# Patient Record
Sex: Male | Born: 1945 | Race: White | Hispanic: No | Marital: Married | Smoking: Current every day smoker
Health system: Southern US, Community
[De-identification: ages and names within clinical notes are randomized; demographics above are authoritative.]

## PROBLEM LIST (undated history)

## (undated) DIAGNOSIS — M1990 Unspecified osteoarthritis, unspecified site: Secondary | ICD-10-CM

## (undated) DIAGNOSIS — N200 Calculus of kidney: Secondary | ICD-10-CM

## (undated) HISTORY — PX: TONSILLECTOMY: SUR1361

## (undated) HISTORY — DX: Calculus of kidney: N20.0

## (undated) HISTORY — DX: Unspecified osteoarthritis, unspecified site: M19.90

---

## 2005-04-01 ENCOUNTER — Ambulatory Visit (HOSPITAL_BASED_OUTPATIENT_CLINIC_OR_DEPARTMENT_OTHER): Admission: RE | Admit: 2005-04-01 | Discharge: 2005-04-01 | Payer: Self-pay | Admitting: Orthopedic Surgery

## 2009-03-09 DIAGNOSIS — N200 Calculus of kidney: Secondary | ICD-10-CM

## 2009-03-09 HISTORY — DX: Calculus of kidney: N20.0

## 2010-05-07 ENCOUNTER — Emergency Department (HOSPITAL_COMMUNITY): Payer: Federal, State, Local not specified - PPO

## 2010-05-07 ENCOUNTER — Emergency Department (HOSPITAL_COMMUNITY)
Admission: EM | Admit: 2010-05-07 | Discharge: 2010-05-08 | Disposition: A | Discharge status: Home / Self Care | Payer: Federal, State, Local not specified - PPO | Attending: Emergency Medicine | Admitting: Emergency Medicine

## 2010-05-07 DIAGNOSIS — N281 Cyst of kidney, acquired: Secondary | ICD-10-CM | POA: Insufficient documentation

## 2010-05-07 DIAGNOSIS — Z7982 Long term (current) use of aspirin: Secondary | ICD-10-CM | POA: Insufficient documentation

## 2010-05-07 DIAGNOSIS — Z87442 Personal history of urinary calculi: Secondary | ICD-10-CM | POA: Insufficient documentation

## 2010-05-07 DIAGNOSIS — F17200 Nicotine dependence, unspecified, uncomplicated: Secondary | ICD-10-CM | POA: Insufficient documentation

## 2010-05-07 DIAGNOSIS — N200 Calculus of kidney: Secondary | ICD-10-CM | POA: Insufficient documentation

## 2010-05-07 DIAGNOSIS — N400 Enlarged prostate without lower urinary tract symptoms: Secondary | ICD-10-CM | POA: Insufficient documentation

## 2010-05-07 LAB — BASIC METABOLIC PANEL
BUN: 16 mg/dL (ref 6–23)
Chloride: 104 mEq/L (ref 96–112)
Creatinine, Ser: 1.09 mg/dL (ref 0.4–1.5)
GFR calc Af Amer: >60 mL/min (ref >60)
GFR calc non Af Amer: >60 mL/min (ref >60)
Potassium: 4.1 mEq/L (ref 3.5–5.1)

## 2010-05-07 LAB — DIFFERENTIAL
Basophils Relative: 1 % (ref 0–1)
Eosinophils Absolute: 0.1 10*3/uL (ref 0.0–0.7)
Eosinophils Relative: 1 % (ref 0–5)
Neutrophils Relative %: 80 % — ABNORMAL HIGH (ref 43–77)

## 2010-05-07 LAB — CBC
MCH: 29.9 pg (ref 26.0–34.0)
MCV: 86.5 fL (ref 78.0–100.0)
Platelets: 249 10*3/uL (ref 150–400)
RBC: 4.88 MIL/uL (ref 4.22–5.81)
RDW: 12.7 % (ref 11.5–15.5)
WBC: 13.3 10*3/uL — ABNORMAL HIGH (ref 4.0–10.5)

## 2010-05-07 LAB — URINALYSIS, ROUTINE W REFLEX MICROSCOPIC
Leukocytes, UA: NEGATIVE
Specific Gravity, Urine: 1.033 — ABNORMAL HIGH (ref 1.005–1.030)
Urine Glucose, Fasting: NEGATIVE mg/dL
pH: 5 (ref 5.0–8.0)

## 2010-05-07 LAB — URINE MICROSCOPIC-ADD ON

## 2010-05-09 LAB — URINE CULTURE: Culture: NO GROWTH

## 2010-11-02 ENCOUNTER — Ambulatory Visit (INDEPENDENT_AMBULATORY_CARE_PROVIDER_SITE_OTHER): Payer: Federal, State, Local not specified - PPO

## 2010-11-02 ENCOUNTER — Inpatient Hospital Stay (INDEPENDENT_AMBULATORY_CARE_PROVIDER_SITE_OTHER)
Admission: RE | Admit: 2010-11-02 | Discharge: 2010-11-02 | Disposition: A | Discharge status: Home / Self Care | Payer: Federal, State, Local not specified - PPO | Source: Ambulatory Visit | Attending: Family Medicine | Admitting: Family Medicine

## 2010-11-02 DIAGNOSIS — J45909 Unspecified asthma, uncomplicated: Secondary | ICD-10-CM

## 2011-02-02 ENCOUNTER — Ambulatory Visit (INDEPENDENT_AMBULATORY_CARE_PROVIDER_SITE_OTHER): Payer: Federal, State, Local not specified - PPO | Admitting: Family Medicine

## 2011-02-02 ENCOUNTER — Encounter: Payer: Self-pay | Admitting: Family Medicine

## 2011-02-02 DIAGNOSIS — M1990 Unspecified osteoarthritis, unspecified site: Secondary | ICD-10-CM

## 2011-02-02 DIAGNOSIS — N529 Male erectile dysfunction, unspecified: Secondary | ICD-10-CM

## 2011-02-02 DIAGNOSIS — F17200 Nicotine dependence, unspecified, uncomplicated: Secondary | ICD-10-CM | POA: Insufficient documentation

## 2011-02-02 DIAGNOSIS — Z23 Encounter for immunization: Secondary | ICD-10-CM

## 2011-02-02 MED ORDER — TADALAFIL 20 MG PO TABS: 20.0000 mg | ORAL_TABLET | Freq: Every day | ORAL | Status: AC | PRN

## 2011-02-02 MED ORDER — VARENICLINE TARTRATE 1 MG PO TABS: 1.0000 mg | ORAL_TABLET | Freq: Two times a day (BID) | ORAL | Status: AC

## 2011-02-02 MED ORDER — VARENICLINE TARTRATE 0.5 MG X 11 & 1 MG X 42 PO MISC: ORAL | Status: AC

## 2011-02-02 NOTE — Progress Notes (Signed)
  Subjective:    Patient ID: Maxwell Leonard, male    DOB: [birth year], 65 y.o.   MRN: [id number]  HPI  New patient to establish care. Past medical history reviewed. Basically no chronic medical problems. He does smoke one half pack cigarettes per day and wishes to quit. Specifically requests trial of Chantix. He's tried nicotine replacement in the past without much success. Previous influenza vaccine. No Pneumovax. No history of known COPD. Denies any chronic cough or dyspnea.  History of osteoarthritis mostly involving hands. Mostly distal interphalangeal joint. No significant pain. No loss of mobility.  Right foot mass. First noted couple years ago after hitting a golf ball. Possibly enlarging in size since then. No pain with ambulation. Nontender to palpation.  History of erectile dysfunction. Previously has used Cialis with good results. Requesting refill. No history of nitroglycerin use. No cardiac history.  Past Medical History  Diagnosis Date  . Arthritis   . Kidney stones 2011    had kidney stones spring of the year and then previously 20 yrs    Past Surgical History  Procedure Date  . Tonsillectomy     reports that he has been smoking Cigarettes.  He has been smoking about .5 packs per day. He has never used smokeless tobacco. He reports that he drinks alcohol. He reports that he does not use illicit drugs. family history includes Alzheimer's disease in his mother; Cancer (age of onset:86) in his father; and Healthy in his daughter and son. No Known Allergies    Review of Systems  Constitutional: Negative for fever, activity change, appetite change and fatigue.  HENT: Negative for ear pain, congestion and trouble swallowing.   Eyes: Negative for pain and visual disturbance.  Respiratory: Negative for cough, shortness of breath and wheezing.   Cardiovascular: Negative for chest pain and palpitations.  Gastrointestinal: Negative for nausea, vomiting, abdominal pain, diarrhea,  constipation, blood in stool, abdominal distention and rectal pain.  Genitourinary: Negative for dysuria, hematuria and testicular pain.  Musculoskeletal: Negative for joint swelling and arthralgias.  Skin: Negative for rash.  Neurological: Negative for dizziness, syncope and headaches.  Hematological: Negative for adenopathy.  Psychiatric/Behavioral: Negative for confusion and dysphoric mood.       Objective:   Physical Exam  Constitutional: He is oriented to person, place, and time. He appears well-developed and well-nourished. No distress.  HENT:  Mouth/Throat: Oropharynx is clear and moist.  Neck: Neck supple. No thyromegaly present.  Cardiovascular: Normal rate and regular rhythm.   Pulmonary/Chest: Effort normal and breath sounds normal. No respiratory distress. He has no wheezes. He has no rales.  Musculoskeletal: He exhibits no edema.       Right foot reveals nontender bony prominence mid and forefoot. Full range of motion ankle  Lymphadenopathy:    He has no cervical adenopathy.  Neurological: He is alert and oriented to person, place, and time.          Assessment & Plan:  #1 nicotine abuse. Discussed methods for cessation. Trial of Chantix with prescription written. Pneumovax given.  Spent about 5 minutes discussing smoking cessation. #2 erectile dysfunction. Cialis 20 mg every other day as needed #3 osteoarthritis involving hands. No significant pain issues #4 right foot mass. Suspect benign bony mass. Offered x-rays but he has established already with orthopedist and will followup with him

## 2011-02-02 NOTE — Patient Instructions (Signed)
Smoking Cessation This document explains the best ways for you to quit smoking and new treatments to help. It lists new medicines that can double or triple your chances of quitting and quitting for good. It also considers ways to avoid relapses and concerns you may have about quitting, including weight gain. NICOTINE: A POWERFUL ADDICTION If you have tried to quit smoking, you know how hard it can be. It is hard because nicotine is a very addictive drug. For some people, it can be as addictive as heroin or cocaine. Usually, people make 2 or 3 tries, or more, before finally being able to quit. Each time you try to quit, you can learn about what helps and what hurts. Quitting takes hard work and a lot of effort, but you can quit smoking. QUITTING SMOKING IS ONE OF THE MOST IMPORTANT THINGS YOU WILL EVER DO.  You will live longer, feel better, and live better.   The impact on your body of quitting smoking is felt almost immediately:   Within 20 minutes, blood pressure decreases. Pulse returns to its normal level.   After 8 hours, carbon monoxide levels in the blood return to normal. Oxygen level increases.   After 24 hours, chance of heart attack starts to decrease. Breath, hair, and body stop smelling like smoke.   After 48 hours, damaged nerve endings begin to recover. Sense of taste and smell improve.   After 72 hours, the body is virtually free of nicotine. Bronchial tubes relax and breathing becomes easier.   After 2 to 12 weeks, lungs can hold more air. Exercise becomes easier and circulation improves.   Quitting will reduce your risk of having a heart attack, stroke, cancer, or lung disease:   After 1 year, the risk of coronary heart disease is cut in half.   After 5 years, the risk of stroke falls to the same as a nonsmoker.   After 10 years, the risk of lung cancer is cut in half and the risk of other cancers decreases significantly.   After 15 years, the risk of coronary heart  disease drops, usually to the level of a nonsmoker.   If you are pregnant, quitting smoking will improve your chances of having a healthy baby.   The people you live with, especially your children, will be healthier.   You will have extra money to spend on things other than cigarettes.  FIVE KEYS TO QUITTING Studies have shown that these 5 steps will help you quit smoking and quit for good. You have the best chances of quitting if you use them together: 1. Get ready.  2. Get support and encouragement.  3. Learn new skills and behaviors.  4. Get medicine to reduce your nicotine addiction and use it correctly.  5. Be prepared for relapse or difficult situations. Be determined to continue trying to quit, even if you do not succeed at first.  1. GET READY  Set a quit date.   Change your environment.   Get rid of ALL cigarettes, ashtrays, matches, and lighters in your home, car, and place of work.   Do not let people smoke in your home.   Review your past attempts to quit. Think about what worked and what did not.   Once you quit, do not smoke. NOT EVEN A PUFF!  2. GET SUPPORT AND ENCOURAGEMENT Studies have shown that you have a better chance of being successful if you have help. You can get support in many ways.  Tell   your family, friends, and coworkers that you are going to quit and need their support. Ask them not to smoke around you.   Talk to your caregivers (doctor, dentist, nurse, pharmacist, psychologist, and/or smoking counselor).   Get individual, group, or telephone counseling and support. The more counseling you have, the better your chances are of quitting. Programs are available at local hospitals and health centers. Call your local health department for information about programs in your area.   Spiritual beliefs and practices may help some smokers quit.   Quit meters are small computer programs online or downloadable that keep track of quit statistics, such as amount  of "quit-time," cigarettes not smoked, and money saved.   Many smokers find one or more of the many self-help books available useful in helping them quit and stay off tobacco.  3. LEARN NEW SKILLS AND BEHAVIORS  Try to distract yourself from urges to smoke. Talk to someone, go for a walk, or occupy your time with a task.   When you first try to quit, change your routine. Take a different route to work. Drink tea instead of coffee. Eat breakfast in a different place.   Do something to reduce your stress. Take a hot bath, exercise, or read a book.   Plan something enjoyable to do every day. Reward yourself for not smoking.   Explore interactive web-based programs that specialize in helping you quit.  4. GET MEDICINE AND USE IT CORRECTLY Medicines can help you stop smoking and decrease the urge to smoke. Combining medicine with the above behavioral methods and support can quadruple your chances of successfully quitting smoking. The [place] Food and Drug Administration (FDA) has approved 7 medicines to help you quit smoking. These medicines fall into 3 categories.  Nicotine replacement therapy (delivers nicotine to your body without the negative effects and risks of smoking):   Nicotine gum: Available over-the-counter.   Nicotine lozenges: Available over-the-counter.   Nicotine inhaler: Available by prescription.   Nicotine nasal spray: Available by prescription.   Nicotine skin patches (transdermal): Available by prescription and over-the-counter.   Antidepressant medicine (helps people abstain from smoking, but how this works is unknown):   Bupropion sustained-release (SR) tablets: Available by prescription.   Nicotinic receptor partial agonist (simulates the effect of nicotine in your brain):   Varenicline tartrate tablets: Available by prescription.   Ask your caregiver for advice about which medicines to use and how to use them. Carefully read the information on the package.    Everyone who is trying to quit may benefit from using a medicine. If you are pregnant or trying to become pregnant, nursing an infant, you are under age 18, or you smoke fewer than 10 cigarettes per day, talk to your caregiver before taking any nicotine replacement medicines.   You should stop using a nicotine replacement product and call your caregiver if you experience nausea, dizziness, weakness, vomiting, fast or irregular heartbeat, mouth problems with the lozenge or gum, or redness or swelling of the skin around the patch that does not go away.   Do not use any other product containing nicotine while using a nicotine replacement product.   Talk to your caregiver before using these products if you have diabetes, heart disease, asthma, stomach ulcers, you had a recent heart attack, you have high blood pressure that is not controlled with medicine, a history of irregular heartbeat, or you have been prescribed medicine to help you quit smoking.  5. BE PREPARED FOR RELAPSE OR   DIFFICULT SITUATIONS  Most relapses occur within the first 3 months after quitting. Do not be discouraged if you start smoking again. Remember, most people try several times before they finally quit.   You may have symptoms of withdrawal because your body is used to nicotine. You may crave cigarettes, be irritable, feel very hungry, cough often, get headaches, or have difficulty concentrating.   The withdrawal symptoms are only temporary. They are strongest when you first quit, but they will go away within 10 to 14 days.  Here are some difficult situations to watch for:  Alcohol. Avoid drinking alcohol. Drinking lowers your chances of successfully quitting.   Caffeine. Try to reduce the amount of caffeine you consume. It also lowers your chances of successfully quitting.   Other smokers. Being around smoking can make you want to smoke. Avoid smokers.   Weight gain. Many smokers will gain weight when they quit, usually  less than 10 pounds. Eat a healthy diet and stay active. Do not let weight gain distract you from your main goal, quitting smoking. Some medicines that help you quit smoking may also help delay weight gain. You can always lose the weight gained after you quit.   Bad mood or depression. There are a lot of ways to improve your mood other than smoking.  If you are having problems with any of these situations, talk to your caregiver. SPECIAL SITUATIONS AND CONDITIONS Studies suggest that everyone can quit smoking. Your situation or condition can give you a special reason to quit.  Pregnant women/new mothers: By quitting, you protect your baby's health and your own.   Hospitalized patients: By quitting, you reduce health problems and help healing.   Heart attack patients: By quitting, you reduce your risk of a second heart attack.   Lung, head, and neck cancer patients: By quitting, you reduce your chance of a second cancer.   Parents of children and adolescents: By quitting, you protect your children from illnesses caused by secondhand smoke.  QUESTIONS TO THINK ABOUT Think about the following questions before you try to stop smoking. You may want to talk about your answers with your caregiver.  Why do you want to quit?   If you tried to quit in the past, what helped and what did not?   What will be the most difficult situations for you after you quit? How will you plan to handle them?   Who can help you through the tough times? Your family? Friends? Caregiver?   What pleasures do you get from smoking? What ways can you still get pleasure if you quit?  Here are some questions to ask your caregiver:  How can you help me to be successful at quitting?   What medicine do you think would be best for me and how should I take it?   What should I do if I need more help?   What is smoking withdrawal like? How can I get information on withdrawal?  Quitting takes hard work and a lot of effort,  but you can quit smoking. FOR MORE INFORMATION  Smokefree.gov (http://www.davis-sullivan.com/) provides free, accurate, evidence-based information and professional assistance to help support the immediate and long-term needs of people trying to quit smoking. Document Released: 02/17/2001 Document Revised: 11/05/2010 Document Reviewed: 12/10/2008 [facility] Patient Information 2012 [place], [place].  Consider complete physical at some point later this year. Follow up with Dr Cleophas Dunker regarding R foot mass.

## 2012-03-01 ENCOUNTER — Encounter: Payer: Self-pay | Admitting: Family Medicine

## 2012-03-01 ENCOUNTER — Ambulatory Visit (INDEPENDENT_AMBULATORY_CARE_PROVIDER_SITE_OTHER): Payer: Federal, State, Local not specified - PPO | Admitting: Family Medicine

## 2012-03-01 VITALS — BP 120/80 | Temp 98.2°F | Wt 176.0 lb

## 2012-03-01 DIAGNOSIS — J9801 Acute bronchospasm: Secondary | ICD-10-CM

## 2012-03-01 MED ORDER — ALBUTEROL SULFATE HFA 108 (90 BASE) MCG/ACT IN AERS: 2.0000 | INHALATION_SPRAY | RESPIRATORY_TRACT | Status: DC | PRN

## 2012-03-01 MED ORDER — DOXYCYCLINE HYCLATE 100 MG PO TABS: 100.0000 mg | ORAL_TABLET | Freq: Two times a day (BID) | ORAL | Status: DC

## 2012-03-01 MED ORDER — PREDNISONE 10 MG PO TABS: ORAL_TABLET | ORAL | Status: DC

## 2012-03-01 NOTE — Progress Notes (Signed)
  Subjective:    Patient ID: Maxwell Leonard, male    DOB: 10-Dec-1945, 66 y.o.   MRN: [id number]  HPI  Acute visit. Patient seen with cough productive of white mucus. Onset about a week ago. No fever. Slight wheezing off and on. Has previously used albuterol inhaler for similar episode which helped. Ongoing nicotine use. Took Mucinex with no significant change of symptoms. Patient denies any hemoptysis, dyspnea, appetite or weight changes.   Review of Systems  Constitutional: Negative for fever, chills, appetite change and unexpected weight change.  HENT: Positive for congestion. Negative for sore throat.   Respiratory: Positive for cough and wheezing. Negative for shortness of breath.   Cardiovascular: Negative for chest pain.       Objective:   Physical Exam  Constitutional: He appears well-developed and well-nourished.  HENT:  Right Ear: External ear normal.  Left Ear: External ear normal.  Mouth/Throat: Oropharynx is clear and moist.  Neck: Neck supple.  Cardiovascular: Normal rate.   Pulmonary/Chest: Effort normal.       Patient has some diffuse wheezes. No rales. No retractions. Normal respiratory rate  Lymphadenopathy:    He has no cervical adenopathy.          Assessment & Plan:  Cough probably related to recent viral URI. He has reactive airway component. Prednisone taper. Doxycycline 100 mg twice a day for 7 days. Albuterol inhaler for as needed use. Continue Mucinex. Followup promptly for fever or worsening symptoms

## 2013-06-06 ENCOUNTER — Encounter: Payer: Self-pay | Admitting: Family Medicine

## 2013-06-06 ENCOUNTER — Ambulatory Visit (INDEPENDENT_AMBULATORY_CARE_PROVIDER_SITE_OTHER): Payer: Federal, State, Local not specified - PPO | Admitting: Family Medicine

## 2013-06-06 ENCOUNTER — Telehealth: Payer: Self-pay | Admitting: Family Medicine

## 2013-06-06 VITALS — BP 122/84 | HR 69 | Wt 180.0 lb

## 2013-06-06 DIAGNOSIS — R140 Abdominal distension (gaseous): Secondary | ICD-10-CM

## 2013-06-06 DIAGNOSIS — Z1211 Encounter for screening for malignant neoplasm of colon: Secondary | ICD-10-CM

## 2013-06-06 DIAGNOSIS — R142 Eructation: Secondary | ICD-10-CM

## 2013-06-06 DIAGNOSIS — R141 Gas pain: Secondary | ICD-10-CM

## 2013-06-06 DIAGNOSIS — R143 Flatulence: Secondary | ICD-10-CM

## 2013-06-06 NOTE — Progress Notes (Signed)
Pre visit review using our clinic review tool, if applicable. No additional management support is needed unless otherwise documented below in the visit note. 

## 2013-06-06 NOTE — Telephone Encounter (Signed)
Relevant patient education mailed to patient.  

## 2013-06-06 NOTE — Patient Instructions (Signed)
We will call you regarding colonoscopy. Avoid carbonated drinks for now Avoid gas producing foods for now-beans, high residue foods next few days.

## 2013-06-06 NOTE — Progress Notes (Signed)
   Subjective:    Patient ID: Maxwell Leonard, male    DOB: [birth year], 68 y.o.   MRN: [id number]  Abdominal Pain Pertinent negatives include no constipation, diarrhea, dysuria, fever, nausea or vomiting.   Patient  presents  with 2 day history of some diffuse lower abdominal mild cramping which he describes as " gas pains". Burping a lot. He took some simethicone over-the-counter which helped somewhat. He denies any nausea, vomiting, diarrhea, constipation, bloody stools, fever, chills. No urinary symptoms. No exacerbating factors. No pain with ambulation. He states he has never had previous screening colonoscopy. No recent appetite or weight changes.  Denies any recent dietary changes  Past Medical History  Diagnosis Date  . Arthritis   . Kidney stones 2011    had kidney stones spring of the year and then previously 20 yrs    Past Surgical History  Procedure Laterality Date  . Tonsillectomy      reports that he has been smoking Cigarettes.  He has been smoking about 0.50 packs per day. He has never used smokeless tobacco. He reports that he drinks alcohol. He reports that he does not use illicit drugs. family history includes Alzheimer's disease in his mother; Cancer (age of onset: 62) in his father; Healthy in his daughter and son. No Known Allergies   Review of Systems  Constitutional: Negative for fever, chills, appetite change and unexpected weight change.  HENT: Negative for trouble swallowing.   Respiratory: Negative for shortness of breath.   Cardiovascular: Negative for chest pain.  Gastrointestinal: Positive for abdominal pain. Negative for nausea, vomiting, diarrhea, constipation, blood in stool and abdominal distention.  Genitourinary: Negative for dysuria.       Objective:   Physical Exam  Constitutional: He appears well-developed and well-nourished.  Cardiovascular: Normal rate and regular rhythm.   Pulmonary/Chest: Effort normal and breath sounds normal. No  respiratory distress. He has no wheezes. He has no rales.  Abdominal: Soft. He exhibits no distension and no mass. There is no tenderness. There is no rebound and no guarding.  Patient has somewhat hyperactive bowel sounds diffusely          Assessment & Plan:  Increased gas and gaseous pains. Nonfocal exam. Continue simethicone. Low-residue diet for the next few days. Schedule screening colonoscopy.

## 2013-07-06 ENCOUNTER — Encounter: Payer: Self-pay | Admitting: Family Medicine

## 2013-12-22 ENCOUNTER — Ambulatory Visit (INDEPENDENT_AMBULATORY_CARE_PROVIDER_SITE_OTHER): Payer: Federal, State, Local not specified - PPO | Admitting: Physician Assistant

## 2013-12-22 ENCOUNTER — Encounter: Payer: Self-pay | Admitting: Physician Assistant

## 2013-12-22 VITALS — BP 108/80 | HR 66 | Temp 98.6°F | Resp 18 | Wt 174.6 lb

## 2013-12-22 DIAGNOSIS — J209 Acute bronchitis, unspecified: Secondary | ICD-10-CM

## 2013-12-22 MED ORDER — PREDNISONE 20 MG PO TABS: 40.0000 mg | ORAL_TABLET | Freq: Every day | ORAL | Status: DC

## 2013-12-22 MED ORDER — DOXYCYCLINE HYCLATE 100 MG PO TABS: 100.0000 mg | ORAL_TABLET | Freq: Two times a day (BID) | ORAL | Status: DC

## 2013-12-22 NOTE — Progress Notes (Signed)
Subjective:    Patient ID: Maxwell Leonard, male    DOB: Nov 07, 1945, 68 y.o.   MRN: [id number]  URI  This is a new problem. The current episode started 1 to 4 weeks ago (10 days). The problem has been gradually worsening. There has been no fever. Associated symptoms include congestion and coughing (sputum). Pertinent negatives include no abdominal pain, chest pain, diarrhea, dysuria, ear pain, headaches, joint pain, joint swelling, nausea, neck pain, plugged ear sensation, rash, rhinorrhea, sinus pain, sneezing, sore throat, swollen glands, vomiting or wheezing. Treatments tried: mucinex, vapor,. The treatment provided mild relief.      Review of Systems  Constitutional: Negative for fever and chills.  HENT: Positive for congestion. Negative for ear pain, postnasal drip, rhinorrhea, sinus pressure, sneezing and sore throat.   Respiratory: Positive for cough (sputum). Negative for shortness of breath and wheezing.   Cardiovascular: Negative for chest pain.  Gastrointestinal: Negative for nausea, vomiting, abdominal pain and diarrhea.  Genitourinary: Negative for dysuria.  Musculoskeletal: Negative for joint pain and neck pain.  Skin: Negative for rash.  Neurological: Negative for syncope and headaches.  All other systems reviewed and are negative.    Past Medical History  Diagnosis Date  . Arthritis   . Kidney stones 2011    had kidney stones spring of the year and then previously 20 yrs     History   Social History  . Marital Status: Married    Spouse Name: N/A    Number of Children: N/A  . Years of Education: N/A   Occupational History  . Not on file.   Social History Main Topics  . Smoking status: Current Every Day Smoker -- 0.50 packs/day    Types: Cigarettes  . Smokeless tobacco: Never Used  . Alcohol Use: Yes  . Drug Use: No  . Sexual Activity: Not on file   Other Topics Concern  . Not on file   Social History Narrative  . No narrative on file    Past  Surgical History  Procedure Laterality Date  . Tonsillectomy      Family History  Problem Relation Age of Onset  . Healthy Daughter   . Healthy Son   . Alzheimer's disease Mother   . Cancer Father 95    lung cancer    No Known Allergies  Current Outpatient Prescriptions on File Prior to Visit  Medication Sig Dispense Refill  . albuterol (PROVENTIL HFA;VENTOLIN HFA) 108 (90 BASE) MCG/ACT inhaler Inhale 2 puffs into the lungs every 4 (four) hours as needed for wheezing.  1 Inhaler  2  . aspirin 81 MG tablet Take 81 mg by mouth daily.        . naproxen sodium (ANAPROX) 220 MG tablet Take 220 mg by mouth. As needed       No current facility-administered medications on file prior to visit.    EXAM: BP 108/80  Pulse 66  Temp(Src) 98.6 F (37 C) (Oral)  Resp 18  Wt 174 lb 9.6 oz (79.198 kg)     Objective:   Physical Exam  Nursing note and vitals reviewed. Constitutional: He is oriented to person, place, and time. He appears well-developed and well-nourished. No distress.  HENT:  Head: Normocephalic and atraumatic.  Mouth/Throat: Oropharynx is clear and moist.  Eyes: Conjunctivae and EOM are normal.  Neck: Normal range of motion. Neck supple.  Cardiovascular: Normal rate, regular rhythm and intact distal pulses.   Pulmonary/Chest: Effort normal. No stridor. No respiratory distress.  He has wheezes. He has no rales. He exhibits no tenderness.  Coarse rhonchi heard bilateral lung bases.  Lymphadenopathy:    He has no cervical adenopathy.  Neurological: He is alert and oriented to person, place, and time.  Skin: Skin is warm and dry. He is not diaphoretic. No pallor.  Psychiatric: He has a normal mood and affect. His behavior is normal. Judgment and thought content normal.    Lab Results  Component Value Date   WBC 13.3* 05/07/2010   HGB 14.6 05/07/2010   HCT 42.2 05/07/2010   PLT 249 05/07/2010   GLUCOSE 115* 05/07/2010   NA 138 05/07/2010   K 4.1 05/07/2010   CL 104  05/07/2010   CREATININE 1.09 05/07/2010   BUN 16 05/07/2010   CO2 26 05/07/2010         Assessment & Plan:  Maxwell Leonard was seen today for cough.  Diagnoses and associated orders for this visit:  Acute bronchitis with bronchospasm Comments: Prednisone for wheezing, Doxycycline, continue mucinex, push fluids, watchful waiting. - doxycycline (VIBRA-TABS) 100 MG tablet; Take 1 tablet (100 mg total) by mouth 2 (two) times daily. - predniSONE (DELTASONE) 20 MG tablet; Take 2 tablets (40 mg total) by mouth daily with breakfast.    Return precautions provided, and patient handout on Bronchitis, wheezing.  Plan to follow up as needed, or for worsening or persistent symptoms despite treatment.  Patient Instructions  Doxycycline twice daily for 7 days. Take each dose with a full meal prevent nausea.  Prednisone 2 pills daily with breakfast decrease inflammation. Take each dose with a full meal to prevent nausea.  Plain Over the Counter Mucinex (NOT Mucinex D) for thick secretions  Force NON dairy fluids, drinking plenty of water is best.    If emergency symptoms discussed during visit developed, seek medical attention immediately.  Followup as needed, or for worsening or persistent symptoms despite treatment.

## 2013-12-22 NOTE — Progress Notes (Signed)
Pre visit review using our clinic review tool, if applicable. No additional management support is needed unless otherwise documented below in the visit note.,j

## 2013-12-22 NOTE — Patient Instructions (Addendum)
Doxycycline twice daily for 7 days. Take each dose with a full meal prevent nausea.  Prednisone 2 pills daily with breakfast decrease inflammation. Take each dose with a full meal to prevent nausea.  Plain Over the Counter Mucinex (NOT Mucinex D) for thick secretions  Force NON dairy fluids, drinking plenty of water is best.    If emergency symptoms discussed during visit developed, seek medical attention immediately.  Followup as needed, or for worsening or persistent symptoms despite treatment.     Acute Bronchitis Bronchitis is when the airways that extend from the windpipe into the lungs get red, puffy, and painful (inflamed). Bronchitis often causes thick spit (mucus) to develop. This leads to a cough. A cough is the most common symptom of bronchitis. In acute bronchitis, the condition usually begins suddenly and goes away over time (usually in 2 weeks). Smoking, allergies, and asthma can make bronchitis worse. Repeated episodes of bronchitis may cause more lung problems. HOME CARE  Rest.  Drink enough fluids to keep your pee (urine) clear or pale yellow (unless you need to limit fluids as told by your doctor).  Only take over-the-counter or prescription medicines as told by your doctor.  Avoid smoking and secondhand smoke. These can make bronchitis worse. If you are a smoker, think about using nicotine gum or skin patches. Quitting smoking will help your lungs heal faster.  Reduce the chance of getting bronchitis again by:  Washing your hands often.  Avoiding people with cold symptoms.  Trying not to touch your hands to your mouth, nose, or eyes.  Follow up with your doctor as told. GET HELP IF: Your symptoms do not improve after 1 week of treatment. Symptoms include:  Cough.  Fever.  Coughing up thick spit.  Body aches.  Chest congestion.  Chills.  Shortness of breath.  Sore throat. GET HELP RIGHT AWAY IF:   You have an increased fever.  You have  chills.  You have severe shortness of breath.  You have bloody thick spit (sputum).  You throw up (vomit) often.  You lose too much body fluid (dehydration).  You have a severe headache.  You faint. MAKE SURE YOU:   Understand these instructions.  Will watch your condition.  Will get help right away if you are not doing well or get worse. Document Released: 08/12/2007 Document Revised: 10/26/2012 Document Reviewed: 08/16/2012 [facility] Patient Information 2015 [place], [place]. This information is not intended to replace advice given to you by your health care provider. Make sure you discuss any questions you have with your health care provider. Bronchospasm A bronchospasm is when the tubes that carry air in and out of your lungs (airways) spasm or tighten. During a bronchospasm it is hard to breathe. This is because the airways get smaller. A bronchospasm can be triggered by:  Allergies. These may be to animals, pollen, food, or mold.  Infection. This is a common cause of bronchospasm.  Exercise.  Irritants. These include pollution, cigarette smoke, strong odors, aerosol sprays, and paint fumes.  Weather changes.  Stress.  Being emotional. HOME CARE   Always have a plan for getting help. Know when to call your doctor and local emergency services (911 in the [place] Know where you can get emergency care.  Only take medicines as told by your doctor.  If you were prescribed an inhaler or nebulizer machine, ask your doctor how to use it correctly. Always use a spacer with your inhaler if you were given one.  Stay  calm during an attack. Try to relax and breathe more slowly.  Control your home environment:  Change your heating and air conditioning filter at least once a month.  Limit your use of fireplaces and wood stoves.  Do not  smoke. Do not  allow smoking in your home.  Avoid perfumes and fragrances.  Get rid of pests (such as roaches and mice) and their  droppings.  Throw away plants if you see mold on them.  Keep your house clean and dust free.  Replace carpet with wood, tile, or vinyl flooring. Carpet can trap dander and dust.  Use allergy-proof pillows, mattress covers, and box spring covers.  Wash bed sheets and blankets every week in hot water. Dry them in a dryer.  Use blankets that are made of polyester or cotton.  Wash hands frequently. GET HELP IF:  You have muscle aches.  You have chest pain.  The thick spit you spit or cough up (sputum) changes from clear or white to yellow, green, gray, or bloody.  The thick spit you spit or cough up gets thicker.  There are problems that may be related to the medicine you are given such as:  A rash.  Itching.  Swelling.  Trouble breathing. GET HELP RIGHT AWAY IF:  You feel you cannot breathe or catch your breath.  You cannot stop coughing.  Your treatment is not helping you breathe better.  You have very bad chest pain. MAKE SURE YOU:   Understand these instructions.  Will watch your condition.  Will get help right away if you are not doing well or get worse. Document Released: 12/21/2008 Document Revised: 02/28/2013 Document Reviewed: 08/16/2012 [facility] Patient Information 2015 [place], [place]. This information is not intended to replace advice given to you by your health care provider. Make sure you discuss any questions you have with your health care provider.

## 2013-12-25 ENCOUNTER — Telehealth: Payer: Self-pay | Admitting: Family Medicine

## 2013-12-25 NOTE — Telephone Encounter (Signed)
emmi mailed  °

## 2013-12-25 NOTE — Telephone Encounter (Signed)
emmi emailed °

## 2014-04-17 ENCOUNTER — Telehealth: Payer: Self-pay

## 2014-04-17 ENCOUNTER — Ambulatory Visit (INDEPENDENT_AMBULATORY_CARE_PROVIDER_SITE_OTHER): Payer: Federal, State, Local not specified - PPO

## 2014-04-17 DIAGNOSIS — Z23 Encounter for immunization: Secondary | ICD-10-CM

## 2014-04-17 NOTE — Telephone Encounter (Signed)
Pt came in for a prevnar 13 today and is requesting a refill of Viagra #20.  Advised pt that he was last given Cialis 20 mg #6 in 2012.  Please advise and send rx to [facility] on [address].

## 2014-04-18 MED ORDER — SILDENAFIL CITRATE 100 MG PO TABS: ORAL_TABLET | ORAL | Status: DC

## 2014-04-18 NOTE — Telephone Encounter (Signed)
Pt states that he does not take Nitroglycerin. Pt aware that Rx is sent to pharmacy .

## 2014-04-18 NOTE — Telephone Encounter (Signed)
We could give Viagra 100 mg 1/2 to one tablet qd prn #6 with 5 refill.  Confirm he is not on nitroglycerin.

## 2014-04-18 NOTE — Telephone Encounter (Signed)
Last visit 05/2013 Viagra is not on his med list.

## 2014-12-31 ENCOUNTER — Encounter: Payer: Self-pay | Admitting: Family Medicine

## 2014-12-31 ENCOUNTER — Ambulatory Visit (INDEPENDENT_AMBULATORY_CARE_PROVIDER_SITE_OTHER): Payer: Federal, State, Local not specified - PPO | Admitting: Family Medicine

## 2014-12-31 VITALS — BP 120/70 | HR 91 | Temp 98.3°F | Wt 167.8 lb

## 2014-12-31 DIAGNOSIS — J209 Acute bronchitis, unspecified: Secondary | ICD-10-CM

## 2014-12-31 MED ORDER — PREDNISONE 20 MG PO TABS: 40.0000 mg | ORAL_TABLET | Freq: Every day | ORAL | Status: DC

## 2014-12-31 MED ORDER — DOXYCYCLINE HYCLATE 100 MG PO CAPS: 100.0000 mg | ORAL_CAPSULE | Freq: Two times a day (BID) | ORAL | Status: DC

## 2014-12-31 NOTE — Progress Notes (Signed)
   Subjective:    Patient ID: Maxwell Leonard, male    DOB: 11-Oct-1945, 69 y.o.   MRN: [id number]  HPI Acute visit. Ongoing smoker who is seen with processing one-week history of productive cough. Cough is worse at night. He has some chills initially but none since then. No fever. Denies any dyspnea. Mild wheezing intermittently. Similar occurrence last year. Still smokes about one half pack cigarettes per day. No recent appetite or weight changes. No hemoptysis.  Past Medical History  Diagnosis Date  . Arthritis   . Kidney stones 2011    had kidney stones spring of the year and then previously 20 yrs    Past Surgical History  Procedure Laterality Date  . Tonsillectomy      reports that he has been smoking Cigarettes.  He has been smoking about 0.50 packs per day. He has never used smokeless tobacco. He reports that he drinks alcohol. He reports that he does not use illicit drugs. family history includes Alzheimer's disease in his mother; Cancer (age of onset: 47) in his father; Healthy in his daughter and son. No Known Allergies    Review of Systems  Constitutional: Negative for fever and chills.  HENT: Negative for congestion and sore throat.   Respiratory: Positive for cough and wheezing. Negative for shortness of breath.   Cardiovascular: Negative for chest pain.       Objective:   Physical Exam  Constitutional: He appears well-developed and well-nourished.  HENT:  Right Ear: External ear normal.  Left Ear: External ear normal.  Mouth/Throat: Oropharynx is clear and moist.  Neck: Neck supple.  Cardiovascular: Normal rate and regular rhythm.   Pulmonary/Chest: Effort normal. No respiratory distress. He has wheezes. He has no rales.  Has only a few faint expiratory wheezes. No rales  Lymphadenopathy:    He has no cervical adenopathy.          Assessment & Plan:  Productive cough in a smoker with reactive airway component. This may be all viral but he does have  productive cough and long history of nicotine use. Prednisone 20 mg 2 tablets daily for 5 days. Continue as needed albuterol. Doxycycline 100 mg twice a day for 10 days. Follow-up promptly for fever or increased shortness of breath.  Suggested complete physical some point within the next year and he will consider. He is encouraged to stop smoking but motivation is low

## 2014-12-31 NOTE — Progress Notes (Signed)
Pre visit review using our clinic review tool, if applicable. No additional management support is needed unless otherwise documented below in the visit note. 

## 2014-12-31 NOTE — Patient Instructions (Signed)
Follow up for any fever or increasing shortness of breath. 

## 2015-02-19 ENCOUNTER — Encounter: Payer: Self-pay | Admitting: Family Medicine

## 2015-02-19 ENCOUNTER — Ambulatory Visit (INDEPENDENT_AMBULATORY_CARE_PROVIDER_SITE_OTHER): Payer: Federal, State, Local not specified - PPO | Admitting: Family Medicine

## 2015-02-19 VITALS — BP 120/70 | HR 83 | Temp 98.2°F | Resp 16 | Wt 171.2 lb

## 2015-02-19 DIAGNOSIS — J209 Acute bronchitis, unspecified: Secondary | ICD-10-CM

## 2015-02-19 MED ORDER — PREDNISONE 20 MG PO TABS: ORAL_TABLET | ORAL | Status: DC

## 2015-02-19 MED ORDER — CEFUROXIME AXETIL 250 MG PO TABS: 250.0000 mg | ORAL_TABLET | Freq: Two times a day (BID) | ORAL | Status: DC

## 2015-02-19 NOTE — Progress Notes (Signed)
   Subjective:    Patient ID: Maxwell Leonard, male    DOB: 1945-05-02, 69 y.o.   MRN: [id number]  HPI Acute visit for cough. Similar cough few months ago which eventually improved following antibiotics and some prednisone. Current cough started a week ago. Mostly clear mucus with sometimes colored mucus early mornings. No definite wheezing. No fevers or chills. Long-term smoker.  Past Medical History  Diagnosis Date  . Arthritis   . Kidney stones 2011    had kidney stones spring of the year and then previously 20 yrs    Past Surgical History  Procedure Laterality Date  . Tonsillectomy      reports that he has been smoking Cigarettes.  He has been smoking about 0.50 packs per day. He has never used smokeless tobacco. He reports that he drinks alcohol. He reports that he does not use illicit drugs. family history includes Alzheimer's disease in his mother; Cancer (age of onset: 32) in his father; Healthy in his daughter and son. No Known Allergies    Review of Systems  Constitutional: Negative for fever and chills.  HENT: Positive for congestion.   Respiratory: Positive for cough. Negative for shortness of breath and wheezing.   Cardiovascular: Negative for chest pain.       Objective:   Physical Exam  Constitutional: He appears well-developed and well-nourished.  HENT:  Right Ear: External ear normal.  Left Ear: External ear normal.  Mouth/Throat: Oropharynx is clear and moist.  Neck: Neck supple.  Cardiovascular: Normal rate and regular rhythm.   Pulmonary/Chest: Effort normal. No respiratory distress. He has wheezes. He has no rales.  Musculoskeletal: He exhibits no edema.  Lymphadenopathy:    He has no cervical adenopathy.          Assessment & Plan:  Cough. Suspect acute bronchitis with some mild bronchospasm. Given long smoking history start Ceftin 250 mg twice daily for 7 days. Continue Mucinex. Prednisone 20 mg 2 tablets daily for 5 days. Proventil as  needed.

## 2015-02-19 NOTE — Progress Notes (Signed)
Pre visit review using our clinic review tool, if applicable. No additional management support is needed unless otherwise documented below in the visit note. 

## 2015-02-19 NOTE — Patient Instructions (Signed)

## 2015-05-29 ENCOUNTER — Other Ambulatory Visit: Payer: Self-pay | Admitting: Orthopaedic Surgery

## 2015-05-29 DIAGNOSIS — M25511 Pain in right shoulder: Secondary | ICD-10-CM

## 2015-06-05 ENCOUNTER — Ambulatory Visit
Admission: RE | Admit: 2015-06-05 | Discharge: 2015-06-05 | Disposition: A | Discharge status: Home / Self Care | Payer: Federal, State, Local not specified - PPO | Source: Ambulatory Visit | Attending: Orthopaedic Surgery | Admitting: Orthopaedic Surgery

## 2015-06-05 DIAGNOSIS — M25511 Pain in right shoulder: Secondary | ICD-10-CM

## 2015-06-18 ENCOUNTER — Ambulatory Visit: Payer: Federal, State, Local not specified - PPO | Attending: Orthopaedic Surgery | Admitting: Physical Therapy

## 2015-06-18 DIAGNOSIS — M25611 Stiffness of right shoulder, not elsewhere classified: Secondary | ICD-10-CM | POA: Diagnosis present

## 2015-06-18 DIAGNOSIS — M25512 Pain in left shoulder: Secondary | ICD-10-CM | POA: Diagnosis present

## 2015-06-18 DIAGNOSIS — M25511 Pain in right shoulder: Secondary | ICD-10-CM | POA: Diagnosis present

## 2015-06-18 DIAGNOSIS — M6281 Muscle weakness (generalized): Secondary | ICD-10-CM | POA: Diagnosis present

## 2015-06-18 DIAGNOSIS — R293 Abnormal posture: Secondary | ICD-10-CM

## 2015-06-18 DIAGNOSIS — M25612 Stiffness of left shoulder, not elsewhere classified: Secondary | ICD-10-CM | POA: Diagnosis present

## 2015-06-18 NOTE — Patient Instructions (Signed)
Chest Flexibility: Shoulder Opener (Doorframe)    Roll shoulders back and down, pressing forward against doorframe. OR USE A CORNER.  Hold for ___30 sec _ breaths. Repeat __3__ times.  Marland Kitchen     Resisted External Rotation: in Neutral - Bilateral    Sit or stand, tubing in both hands, elbows at sides, bent to 90, forearms forward. Pinch shoulder blades together and rotate forearms out. Keep elbows at sides. Repeat __10__ times per set. Do __1-2__ sets per session. Do __2__ sessions per day.   Resisted Horizontal Abduction: Bilateral    Sit or stand, tubing in both hands, arms out in front. Keeping arms straight, pinch shoulder blades together and stretch arms out. Repeat __10__ times per set. Do _1-2___ sets per session. Do ___2_ sessions per day.  http://orth.exer.us/968   Copyright  VHI. All rights reserved.

## 2015-06-18 NOTE — Therapy (Signed)
[place], [place], [address] Phone: [phone]   Fax:  [phone]  Physical Therapy Evaluation  Patient Details  Name: Maxwell Leonard MRN: [id number] Date of Birth: Feb 18, 1946 Referring Provider: Dr. Joni Fears   Encounter Date: 06/18/2015      PT End of Session - 06/18/15 0904    Visit Number 1   Number of Visits 12   Date for PT Re-Evaluation 07/30/15   PT Start Time 0803   PT Stop Time 0850   PT Time Calculation (min) 47 min   Activity Tolerance Patient tolerated treatment well   Behavior During Therapy [facility] for tasks assessed/performed      Past Medical History  Diagnosis Date  . Arthritis   . Kidney stones 2011    had kidney stones spring of the year and then previously 20 yrs     Past Surgical History  Procedure Laterality Date  . Tonsillectomy      There were no vitals filed for this visit.       Subjective Assessment - 06/18/15 0806    Subjective Pt reports sudden onset of bilateral shoulder pain in Jan 2017 (R>L)  . Prior to injury he was very Astronomer and playing golf.  He reports painting around that time (overhead).  An injection helped the Rt. side for 2-3 weeks in Rt. UE.   With meds and ice he reports being 90% of full function.  Feels weaker, denies sesnory. HE reports being disappointed that he can't enjoy being active and outside as much as he used to.    Pertinent History OA   Limitations House hold activities;Lifting;Other (comment)  yardwork, recreational    Diagnostic tests MRI done showed tendinosis in Rt. infraspinatus and supraspinatus   Patient Stated Goals return to playing golf without pain, physical   Currently in Pain? Yes   Pain Score 4   can be as high as 6/10 each AM    Pain Location Shoulder   Pain Orientation Right;Left  Rt. worse than L    Pain Descriptors / Indicators Aching   Pain Type Chronic pain   Pain Radiating Towards prox  shoulder   Pain Onset More than a month ago   Pain Frequency Intermittent   Aggravating Factors  ice, meds    Pain Relieving Factors reaching out and up    Effect of Pain on Daily Activities feels he is getting weaker, cannott be as active as he can be.             [facility] PT Assessment - 06/18/15 0819    Assessment   Medical Diagnosis impingment    Referring Provider Dr. Joni Fears    Onset Date/Surgical Date --  Jan 2017   Hand Dominance Right   Next MD Visit none, is released from PT   Prior Therapy No    Precautions   Precautions None   Restrictions   Weight Bearing Restrictions Yes   Balance Screen   Has the patient fallen in the past 6 months No   [place] residence   Prior Function   Vocation Full time employment   Leisure golf, being outside    Cognition   Overall Cognitive Status Within Functional Limits for tasks assessed   Observation/Other Assessments   Focus on Therapeutic Outcomes (FOTO)  55%   Sensation   Light Touch Appears Intact   Coordination   Gross Motor Movements are Fluid and  Coordinated Not tested   Posture/Postural Control   Posture/Postural Control Postural limitations   Postural Limitations Rounded Shoulders;Forward head;Increased thoracic kyphosis;Posterior pelvic tilt   Posture Comments Rt. shoulder lower, scap inferiorly rotated and abd from midline    AROM   Right Shoulder Extension 50 Degrees   Right Shoulder Flexion 155 Degrees   Right Shoulder ABduction 155 Degrees  painful at 90 (arc)   Right Shoulder Internal Rotation --  T8   Right Shoulder External Rotation --  T3   Left Shoulder Extension 60 Degrees   Left Shoulder Flexion 149 Degrees   Left Shoulder ABduction 155 Degrees   Left Shoulder Internal Rotation --  T9   Left Shoulder External Rotation --  T2   PROM   PROM Assessment Site --  Full PROM, end range pain in abd and ER combined on L/R   Strength   Right Shoulder Flexion  4+/5   Right Shoulder ABduction 3+/5   Right Shoulder Internal Rotation 4+/5   Right Shoulder External Rotation 4+/5   Left Shoulder Flexion 4+/5   Left Shoulder ABduction 3+/5   Left Shoulder Internal Rotation 4/5   Left Shoulder External Rotation 4+/5   Right Elbow Flexion 5/5   Right Elbow Extension 4+/5   Left Elbow Flexion 5/5   Left Elbow Extension 4/5   Palpation   Palpation comment min tenderness in bilateral UEs, no pain.                    [facility] Adult PT Treatment/Exercise - 06/18/15 0938    Self-Care   Self-Care Posture;Other Self-Care Comments   Posture forward head, how posture affects shoulder position   Other Self-Care Comments  anatomy, rotator cuff   Shoulder Exercises: Standing   Horizontal ABduction Strengthening;Both;10 reps;Theraband   Theraband Level (Shoulder Horizontal ABduction) Level 3 (Green)   Horizontal ABduction Weight (lbs) HEP   External Rotation Strengthening;Both;20 reps   Theraband Level (Shoulder External Rotation) Level 3 (Green)   External Rotation Weight (lbs) HEP   Other Standing Exercises Corner stretch 30 sec x 3 HEP       Gave patient red band for home use as well.  Needed mod cues for correct technique.             PT Education - 06/18/15 0903    Education provided Yes   Education Details PT/POC, HEP and posture, alignment, impingement, anatomy   Person(s) Educated Patient   Methods Explanation;Demonstration;Tactile cues;Verbal cues;Handout   Comprehension Returned demonstration;Verbalized understanding;Need further instruction             PT Long Term Goals - 06/18/15 0910    PT LONG TERM GOAL #1   Title Pt will be I with HEP for UE ROM, Strength and posture    Time 6   Period Weeks   Status New   PT LONG TERM GOAL #2   Title Pt will be able to wake with pain < 3 /10 and require no ice application 123XX123 of the time   Time 6   Period Weeks   Status New   PT LONG TERM GOAL #3   Title Pt will be able  to reach to 100 deg (for coffee cup) in AM with no increase in pain    Time 6   Period Weeks   Status New   PT LONG TERM GOAL #4   Title Pt will be able to do yardwork as needed without increase in pain  Time 6   Period Weeks   Status New   PT LONG TERM GOAL #5   Title Pt will be able to play golf with modifications and min increase in pain.    Time 6   Period Weeks   Status New               Plan - 06/18/15 0904    Clinical Impression Statement Patient presents with pain in bilateral shoulders, min weakness and stiffness in bilateral UE, postural imbalance. He needs cues to maintain good posture, was receptive to information regarding posture.  Pain did not increase during his session.     Rehab Potential Excellent   PT Frequency 2x / week   PT Duration 6 weeks   PT Treatment/Interventions Ultrasound;Neuromuscular re-education;Therapeutic exercise;Manual techniques;Moist Heat;Electrical Stimulation;Iontophoresis 4mg /ml Dexamethasone;Cryotherapy;Taping;Passive range of motion;Patient/family education   PT Next Visit Plan check initial HEP, education on posture, prone scap stablization (use ball?)   PT Home Exercise Plan horiz abd, ER/IR, and corner stretch    Consulted and Agree with Plan of Care Patient      Patient will benefit from skilled therapeutic intervention in order to improve the following deficits and impairments:  Pain, Postural dysfunction, Decreased strength, Impaired flexibility, Decreased range of motion, Increased fascial restricitons, Impaired UE functional use  Visit Diagnosis: Pain in left shoulder - Plan: PT plan of care cert/re-cert  Pain in right shoulder - Plan: PT plan of care cert/re-cert  Stiffness of right shoulder, not elsewhere classified - Plan: PT plan of care cert/re-cert  Stiffness of left shoulder, not elsewhere classified - Plan: PT plan of care cert/re-cert  Abnormal posture - Plan: PT plan of care cert/re-cert  Muscle weakness  (generalized) - Plan: PT plan of care cert/re-cert     Problem List Patient Active Problem List   Diagnosis Date Noted  . Osteoarthritis 02/02/2011  . Nicotine use disorder 02/02/2011  . Erectile dysfunction 02/02/2011    Easton Fetty 06/18/2015, 9:48 AM  [place], [place], [address] Phone: [phone]   Fax:  [phone]  Name: Maxwell Leonard MRN: [id number] Date of Birth: [birth year]   Raeford Razor, PT 06/18/2015 9:48 AM Phone: [phone] Fax: [phone]

## 2015-06-24 ENCOUNTER — Ambulatory Visit: Payer: Federal, State, Local not specified - PPO | Admitting: Physical Therapy

## 2015-06-24 DIAGNOSIS — R293 Abnormal posture: Secondary | ICD-10-CM

## 2015-06-24 DIAGNOSIS — M25612 Stiffness of left shoulder, not elsewhere classified: Secondary | ICD-10-CM

## 2015-06-24 DIAGNOSIS — M25511 Pain in right shoulder: Secondary | ICD-10-CM

## 2015-06-24 DIAGNOSIS — M6281 Muscle weakness (generalized): Secondary | ICD-10-CM

## 2015-06-24 DIAGNOSIS — M25512 Pain in left shoulder: Secondary | ICD-10-CM

## 2015-06-24 DIAGNOSIS — M25611 Stiffness of right shoulder, not elsewhere classified: Secondary | ICD-10-CM

## 2015-06-24 NOTE — Therapy (Signed)
[place], [place], [address] Phone: [phone]   Fax:  [phone]  Physical Therapy Treatment  Patient Details  Name: GEOVONNIE KOONZ MRN: [id number] Date of Birth: 05-Jun-1945 Referring Provider: Dr. Joni Fears   Encounter Date: 06/24/2015      PT End of Session - 06/24/15 1237    Visit Number 2   Number of Visits 12   Date for PT Re-Evaluation 07/30/15   PT Start Time 1149   PT Stop Time 1230   PT Time Calculation (min) 41 min   Activity Tolerance Patient tolerated treatment well   Behavior During Therapy [facility] for tasks assessed/performed      Past Medical History  Diagnosis Date  . Arthritis   . Kidney stones 2011    had kidney stones spring of the year and then previously 20 yrs     Past Surgical History  Procedure Laterality Date  . Tonsillectomy      There were no vitals filed for this visit.      Subjective Assessment - 06/24/15 1155    Subjective Patient denies pain or it is very minimal when he takes celebrex. Reports complinace with HEP. Denies difficulty sleeping but feels bilateral shoulder pain in the morning and has easier time reaching for coffee mug. +   Pertinent History OA   Limitations House hold activities;Lifting;Other (comment)   Diagnostic tests MRI done showed tendinosis in Rt. infraspinatus and supraspinatus   Patient Stated Goals return to playing golf without pain, physical   Currently in Pain? No/denies                         [facility] Adult PT Treatment/Exercise - 06/24/15 0001    Exercises   Exercises Shoulder;Other Exercises   Other Exercises  UBE retro 5 min   Shoulder Exercises: Standing   Horizontal ABduction Strengthening;Both;10 reps;Theraband   Theraband Level (Shoulder Horizontal ABduction) Level 3 (Green)   External Rotation Strengthening;Both;20 reps   Theraband Level (Shoulder External Rotation) Level 3 (Green)   Extension  Other (comment)  D2 extension   Theraband Level (Shoulder Extension) Level 2 (Red)   Extension Limitations D2 extension   Theraband Level (Shoulder Row) Level 3 (Green)  3x10   Other Standing Exercises Corner stretch 30 sec x 3 HEP    Other Standing Exercises lat pull down machine  25lb x20   Shoulder Exercises: ROM/Strengthening   Other ROM/Strengthening Exercises triceps pulls GTB x20   Other ROM/Strengthening Exercises abduction cirvles x30 A/P                PT Education - 06/24/15 1236    Education provided Yes   Education Details posture and form with exercises   Person(s) Educated Patient   Methods Explanation;Demonstration;Tactile cues;Verbal cues   Comprehension Verbalized understanding;Returned demonstration;Verbal cues required             PT Long Term Goals - 06/18/15 0910    PT LONG TERM GOAL #1   Title Pt will be I with HEP for UE ROM, Strength and posture    Time 6   Period Weeks   Status New   PT LONG TERM GOAL #2   Title Pt will be able to wake with pain < 3 /10 and require no ice application 123XX123 of the time   Time 6   Period Weeks   Status New   PT LONG TERM GOAL #3  Title Pt will be able to reach to 100 deg (for coffee cup) in AM with no increase in pain    Time 6   Period Weeks   Status New   PT LONG TERM GOAL #4   Title Pt will be able to do yardwork as needed without increase in pain    Time 6   Period Weeks   Status New   PT LONG TERM GOAL #5   Title Pt will be able to play golf with modifications and min increase in pain.    Time 6   Period Weeks   Status New               Plan - 06/24/15 1238    Clinical Impression Statement Patient required cuing to slow down and perform exercises with appropriate form. Will continue to benefit from skilled PT to address deficits listed. Denied increase in pain due to celebrex.    PT Frequency 2x / week   PT Duration 6 weeks   PT Treatment/Interventions Ultrasound;Neuromuscular  re-education;Therapeutic exercise;Manual techniques;Moist Heat;Electrical Stimulation;Iontophoresis 4mg /ml Dexamethasone;Cryotherapy;Taping;Passive range of motion;Patient/family education   PT Next Visit Plan check initial HEP, education on posture, prone scap stablization (use ball?)   PT Home Exercise Plan horiz abd, ER/IR, and corner stretch       Patient will benefit from skilled therapeutic intervention in order to improve the following deficits and impairments:  Pain, Postural dysfunction, Decreased strength, Impaired flexibility, Decreased range of motion, Increased fascial restricitons, Impaired UE functional use  Visit Diagnosis: Pain in left shoulder  Pain in right shoulder  Stiffness of right shoulder, not elsewhere classified  Abnormal posture  Muscle weakness (generalized)  Stiffness of left shoulder, not elsewhere classified     Problem List Patient Active Problem List   Diagnosis Date Noted  . Osteoarthritis 02/02/2011  . Nicotine use disorder 02/02/2011  . Erectile dysfunction 02/02/2011   Melisssa Donner C. Mandalyn Pasqua PT, DPT 06/24/2015 12:51 PM   [facility] [address] [place], [place], [address] Phone: [phone]   Fax:  [phone]  Name: HIRAN HARNESS MRN: [id number] Date of Birth: May 20, 1945

## 2015-06-27 ENCOUNTER — Ambulatory Visit: Payer: Federal, State, Local not specified - PPO | Admitting: Physical Therapy

## 2015-07-01 ENCOUNTER — Ambulatory Visit: Payer: Federal, State, Local not specified - PPO | Admitting: Physical Therapy

## 2015-07-01 DIAGNOSIS — M25512 Pain in left shoulder: Secondary | ICD-10-CM

## 2015-07-01 DIAGNOSIS — M25612 Stiffness of left shoulder, not elsewhere classified: Secondary | ICD-10-CM

## 2015-07-01 DIAGNOSIS — M25511 Pain in right shoulder: Secondary | ICD-10-CM

## 2015-07-01 DIAGNOSIS — R293 Abnormal posture: Secondary | ICD-10-CM

## 2015-07-01 DIAGNOSIS — M6281 Muscle weakness (generalized): Secondary | ICD-10-CM

## 2015-07-01 DIAGNOSIS — M25611 Stiffness of right shoulder, not elsewhere classified: Secondary | ICD-10-CM

## 2015-07-01 NOTE — Therapy (Signed)
[place], [place], [address] Phone: [phone]   Fax:  [phone]  Physical Therapy Treatment  Patient Details  Name: Maxwell Leonard MRN: [id number] Date of Birth: 10-27-1945 Referring Provider: Dr. Joni Fears   Encounter Date: 07/01/2015      PT End of Session - 07/01/15 0804    Visit Number 3   Number of Visits 12   Date for PT Re-Evaluation 07/30/15   PT Start Time 0758   PT Stop Time 0847   PT Time Calculation (min) 49 min   Activity Tolerance Patient tolerated treatment well   Behavior During Therapy [facility] for tasks assessed/performed      Past Medical History  Diagnosis Date  . Arthritis   . Kidney stones 2011    had kidney stones spring of the year and then previously 20 yrs     Past Surgical History  Procedure Laterality Date  . Tonsillectomy      There were no vitals filed for this visit.      Subjective Assessment - 07/01/15 0802    Subjective Waiting for celebrex to kick in.  Both hands are swollen today. Everythign weighs 25 lbs today.  Pre-PT i could not reach up at all.  I can do that now.    Currently in Pain? Yes   Pain Score 5    Pain Location Shoulder  both    Pain Orientation Right;Left   Pain Descriptors / Indicators Aching;Sore   Pain Type Chronic pain   Pain Onset More than a month ago   Aggravating Factors  overdoing it   Pain Relieving Factors ice, meds          [facility] Adult PT Treatment/Exercise - 07/01/15 0805    Shoulder Exercises: Supine   Flexion AAROM;Both;10 reps;Other (comment)   Shoulder Flexion Weight (lbs) bilat and alternating    Other Supine Exercises dead bug on foam roller for core strength, needed close supervision at first   Shoulder Exercises: Prone   Retraction Strengthening   Extension Both;10 reps   Horizontal ABduction 1 Strengthening;Both;10 reps   Horizontal ABduction 1 Weight (lbs) 3 sets ([place])    Shoulder Exercises:  Standing   Horizontal ABduction Strengthening;Both;15 reps;Theraband   Theraband Level (Shoulder Horizontal ABduction) Level 2 (Red);Other (comment)   Horizontal ABduction Weight (lbs) foam against wall    External Rotation Strengthening;Both;10 reps;Theraband   Theraband Level (Shoulder External Rotation) Level 2 (Red)   External Rotation Weight (lbs) foam roller ag wall    Row Both;10 reps   Theraband Level (Shoulder Row) Level 3 (Green)   Row Weight (lbs) another set on [employer] 2 plates with mod ciues for scapular position   Other Standing Exercises Freemotion: diagonal pull 1 plate controlled trunk rotation (D1)   Shoulder Exercises: ROM/Strengthening   UBE (Upper Arm Bike) 6 min level 1, both directions    Other ROM/Strengthening Exercises Standing UE Ranger: flexion and abduction, horiz abd and add, about 1 min each arm, each plane                PT Education - 07/01/15 0931    Education provided Yes   Education Details posture, form, foam roller   Person(s) Educated Patient   Methods Explanation   Comprehension Verbalized understanding;Verbal cues required;Tactile cues required;Need further instruction             PT Long Term Goals - 07/01/15 0804    PT LONG TERM GOAL #1  Title Pt will be I with HEP for UE ROM, Strength and posture    Status On-going   PT LONG TERM GOAL #2   Title Pt will be able to wake with pain < 3 /10 and require no ice application >69% of the time   Status On-going   PT LONG TERM GOAL #3   Title Pt will be able to reach to 100 deg (for coffee cup) in AM with no increase in pain    Status On-going   PT LONG TERM GOAL #4   Title Pt will be able to do yardwork as needed without increase in pain    Status On-going   PT LONG TERM GOAL #5   Title Pt will be able to play golf with modifications and min increase in pain.    Status On-going               Plan - 07/01/15 0813    Clinical Impression Statement Pt reports an  increase in shoulder pain, hand swelling.  He over-exercised Friday night was was very sore.  He needs constant cues to slow down and keep good form.  No goals met.    PT Next Visit Plan emphasize form and posture of neck, scap with exercises.  Consider ionto/tape? add [name]?   PT Home Exercise Plan horiz abd, ER/IR, and corner stretch    Consulted and Agree with Plan of Care Patient      Patient will benefit from skilled therapeutic intervention in order to improve the following deficits and impairments:  Pain, Postural dysfunction, Decreased strength, Impaired flexibility, Decreased range of motion, Increased fascial restricitons, Impaired UE functional use  Visit Diagnosis: Pain in left shoulder  Pain in right shoulder  Stiffness of right shoulder, not elsewhere classified  Abnormal posture  Muscle weakness (generalized)  Stiffness of left shoulder, not elsewhere classified     Problem List Patient Active Problem List   Diagnosis Date Noted  . Osteoarthritis 02/02/2011  . Nicotine use disorder 02/02/2011  . Erectile dysfunction 02/02/2011    Maxwell Leonard 07/01/2015, 9:32 AM  [place], [place], [address] Phone: [phone]   Fax:  [phone]  Name: Maxwell Leonard MRN: [id number] Date of Birth: September 08, 1945    Raeford Razor, PT 07/01/2015 9:32 AM Phone: [phone] Fax: [phone]

## 2015-07-02 ENCOUNTER — Ambulatory Visit (INDEPENDENT_AMBULATORY_CARE_PROVIDER_SITE_OTHER): Payer: Federal, State, Local not specified - PPO | Admitting: Family Medicine

## 2015-07-02 ENCOUNTER — Encounter: Payer: Self-pay | Admitting: Family Medicine

## 2015-07-02 VITALS — BP 136/76 | HR 79 | Temp 98.2°F | Ht 65.5 in | Wt 171.0 lb

## 2015-07-02 DIAGNOSIS — R062 Wheezing: Secondary | ICD-10-CM | POA: Diagnosis not present

## 2015-07-02 DIAGNOSIS — F17200 Nicotine dependence, unspecified, uncomplicated: Secondary | ICD-10-CM

## 2015-07-02 DIAGNOSIS — M150 Primary generalized (osteo)arthritis: Secondary | ICD-10-CM

## 2015-07-02 DIAGNOSIS — M159 Polyosteoarthritis, unspecified: Secondary | ICD-10-CM

## 2015-07-02 MED ORDER — PREDNISONE 20 MG PO TABS
ORAL_TABLET | ORAL | Status: DC
Start: 2015-07-02 — End: 2016-01-20

## 2015-07-02 MED ORDER — CEFUROXIME AXETIL 250 MG PO TABS: 250.0000 mg | ORAL_TABLET | Freq: Two times a day (BID) | ORAL | Status: DC

## 2015-07-02 NOTE — Progress Notes (Signed)
   Subjective:    Patient ID: Maxwell Leonard, male    DOB: 1945/10/14, 70 y.o.   MRN: [id number]  HPI   Patient is a long-time smoker who is seen with one-week history of productive cough. Intermittent wheezing. No fevers or chills. No hemoptysis. No reported appetite or weight changes. Prior chest x-ray several years ago showed probable COPD changes. He states he has never had pulmonary function tests. Had very similar flareup several months ago treated with Ceftin and prednisone and improved  Has albuterol inhaler which he rarely uses. He has apparently had about 2-3 flareups with likely acute exacerbation of COPD per year Still smokes about one half pack cigarettes per day.  Osteoarthritis multiple joints.  Takes Celebrex with good relief.  No recent GI symptoms.     Past Medical History  Diagnosis Date  . Arthritis   . Kidney stones 2011    had kidney stones spring of the year and then previously 20 yrs    Past Surgical History  Procedure Laterality Date  . Tonsillectomy      reports that he has been smoking Cigarettes.  He has been smoking about 0.50 packs per day. He has never used smokeless tobacco. He reports that he drinks alcohol. He reports that he does not use illicit drugs. family history includes Alzheimer's disease in his mother; Cancer (age of onset: 19) in his father; Healthy in his daughter and son. No Known Allergies    Review of Systems  Constitutional: Positive for fatigue. Negative for fever, chills, appetite change and unexpected weight change.  HENT: Positive for congestion.   Respiratory: Positive for cough and wheezing.   Cardiovascular: Negative for chest pain.  Gastrointestinal: Negative for abdominal pain.  Musculoskeletal: Positive for arthralgias.  Neurological: Negative for dizziness and syncope.       Objective:   Physical Exam  Constitutional: He appears well-developed and well-nourished. No distress.  HENT:  Right Ear: External ear  normal.  Left Ear: External ear normal.  Mouth/Throat: Oropharynx is clear and moist.  Neck: Neck supple.  Cardiovascular: Normal rate and regular rhythm.   Pulmonary/Chest: Effort normal. He has wheezes. He has no rales.  Musculoskeletal: He exhibits no edema.  Lymphadenopathy:    He has no cervical adenopathy.  Neurological: He is alert.          Assessment & Plan:  Acute exacerbation of chronic obstructive pulmonary disease. He has never actually had any formal PFTs apparently. Strongly encouraged to stop smoking. Continue Ventolin as needed. Prednisone 20 mg 2 tablets daily for 7 days and Ceftin 250 mg twice a day for 7 days.  Set up spirometry with graph.  VERY likely has COPD and will probably benefit from daily medication   Nicotine use.  Discussed cessation.  Pt not ready yet.  He is strongly urged to consider.     Osteoarthritis multiple joints.  Continue with Celebrex prn.  Eulas Post MD [facility]

## 2015-07-02 NOTE — Patient Instructions (Signed)
Chronic Obstructive Pulmonary Disease Chronic obstructive pulmonary disease (COPD) is a common lung condition in which airflow from the lungs is limited. COPD is a general term that can be used to describe many different lung problems that limit airflow, including both chronic bronchitis and emphysema. If you have COPD, your lung function will probably never return to normal, but there are measures you can take to improve lung function and make yourself feel better. CAUSES   Smoking (common).  Exposure to secondhand smoke.  Genetic problems.  Chronic inflammatory lung diseases or recurrent infections. SYMPTOMS  Shortness of breath, especially with physical activity.  Deep, persistent (chronic) cough with a large amount of thick mucus.  Wheezing.  Rapid breaths (tachypnea).  Gray or bluish discoloration (cyanosis) of the skin, especially in your fingers, toes, or lips.  Fatigue.  Weight loss.  Frequent infections or episodes when breathing symptoms become much worse (exacerbations).  Chest tightness. DIAGNOSIS Your health care provider will take a medical history and perform a physical examination to diagnose COPD. Additional tests for COPD may include:  Lung (pulmonary) function tests.  Chest X-ray.  CT scan.  Blood tests. TREATMENT  Treatment for COPD may include:  Inhaler and nebulizer medicines. These help manage the symptoms of COPD and make your breathing more comfortable.  Supplemental oxygen. Supplemental oxygen is only helpful if you have a low oxygen level in your blood.  Exercise and physical activity. These are beneficial for nearly all people with COPD.  Lung surgery or transplant.  Nutrition therapy to gain weight, if you are underweight.  Pulmonary rehabilitation. This may involve working with a team of health care providers and specialists, such as respiratory, occupational, and physical therapists. HOME CARE INSTRUCTIONS  Take all medicines  (inhaled or pills) as directed by your health care provider.  Avoid over-the-counter medicines or cough syrups that dry up your airway (such as antihistamines) and slow down the elimination of secretions unless instructed otherwise by your health care provider.  If you are a smoker, the most important thing that you can do is stop smoking. Continuing to smoke will cause further lung damage and breathing trouble. Ask your health care provider for help with quitting smoking. He or she can direct you to community resources or hospitals that provide support.  Avoid exposure to irritants such as smoke, chemicals, and fumes that aggravate your breathing.  Use oxygen therapy and pulmonary rehabilitation if directed by your health care provider. If you require home oxygen therapy, ask your health care provider whether you should purchase a pulse oximeter to measure your oxygen level at home.  Avoid contact with individuals who have a contagious illness.  Avoid extreme temperature and humidity changes.  Eat healthy foods. Eating smaller, more frequent meals and resting before meals may help you maintain your strength.  Stay active, but balance activity with periods of rest. Exercise and physical activity will help you maintain your ability to do things you want to do.  Preventing infection and hospitalization is very important when you have COPD. Make sure to receive all the vaccines your health care provider recommends, especially the pneumococcal and influenza vaccines. Ask your health care provider whether you need a pneumonia vaccine.  Learn and use relaxation techniques to manage stress.  Learn and use controlled breathing techniques as directed by your health care provider. Controlled breathing techniques include:  Pursed lip breathing. Start by breathing in (inhaling) through your nose for 1 second. Then, purse your lips as if you were   going to whistle and breathe out (exhale) through the  pursed lips for 2 seconds.  Diaphragmatic breathing. Start by putting one hand on your abdomen just above your waist. Inhale slowly through your nose. The hand on your abdomen should move out. Then purse your lips and exhale slowly. You should be able to feel the hand on your abdomen moving in as you exhale.  Learn and use controlled coughing to clear mucus from your lungs. Controlled coughing is a series of short, progressive coughs. The steps of controlled coughing are: 1. Lean your head slightly forward. 2. Breathe in deeply using diaphragmatic breathing. 3. Try to hold your breath for 3 seconds. 4. Keep your mouth slightly open while coughing twice. 5. Spit any mucus out into a tissue. 6. Rest and repeat the steps once or twice as needed. SEEK MEDICAL CARE IF:  You are coughing up more mucus than usual.  There is a change in the color or thickness of your mucus.  Your breathing is more labored than usual.  Your breathing is faster than usual. SEEK IMMEDIATE MEDICAL CARE IF:  You have shortness of breath while you are resting.  You have shortness of breath that prevents you from:  Being able to talk.  Performing your usual physical activities.  You have chest pain lasting longer than 5 minutes.  Your skin color is more cyanotic than usual.  You measure low oxygen saturations for longer than 5 minutes with a pulse oximeter. MAKE SURE YOU:  Understand these instructions.  Will watch your condition.  Will get help right away if you are not doing well or get worse.   This information is not intended to replace advice given to you by your health care provider. Make sure you discuss any questions you have with your health care provider.   Document Released: 12/03/2004 Document Revised: 03/16/2014 Document Reviewed: 10/20/2012 Elsevier Interactive Patient Education 2016 [employer]  

## 2015-07-02 NOTE — Progress Notes (Signed)
Pre visit review using our clinic review tool, if applicable. No additional management support is needed unless otherwise documented below in the visit note. 

## 2015-07-03 ENCOUNTER — Ambulatory Visit: Payer: Federal, State, Local not specified - PPO | Admitting: Physical Therapy

## 2015-07-03 DIAGNOSIS — M25512 Pain in left shoulder: Secondary | ICD-10-CM

## 2015-07-03 DIAGNOSIS — M25611 Stiffness of right shoulder, not elsewhere classified: Secondary | ICD-10-CM

## 2015-07-03 DIAGNOSIS — M25612 Stiffness of left shoulder, not elsewhere classified: Secondary | ICD-10-CM

## 2015-07-03 DIAGNOSIS — R293 Abnormal posture: Secondary | ICD-10-CM

## 2015-07-03 DIAGNOSIS — M6281 Muscle weakness (generalized): Secondary | ICD-10-CM

## 2015-07-03 DIAGNOSIS — M25511 Pain in right shoulder: Secondary | ICD-10-CM

## 2015-07-03 NOTE — Therapy (Signed)
[place], [place], [address] Phone: [phone]   Fax:  [phone]  Physical Therapy Treatment  Patient Details  Name: Maxwell Leonard MRN: [id number] Date of Birth: October 09, 1945 Referring Provider: Dr. Joni Fears   Encounter Date: 07/03/2015    Past Medical History  Diagnosis Date  . Arthritis   . Kidney stones 2011    had kidney stones spring of the year and then previously 20 yrs     Past Surgical History  Procedure Laterality Date  . Tonsillectomy      There were no vitals filed for this visit.      Subjective Assessment - 07/03/15 1346    Subjective I have no pain. I am so happy! Some tightness in Rt. posterior shoulder blade. Not swollen in hands.    Currently in Pain? No/denies            [facility] PT Assessment - 07/03/15 1425    AROM   Right Shoulder Flexion 160 Degrees   Left Shoulder Flexion 155 Degrees          [facility] Adult PT Treatment/Exercise - 07/03/15 1350    Self-Care   Other Self-Care Comments  HEP and exercise routine recommendations   Shoulder Exercises: Sidelying   Other Sidelying Exercises Patient demonstrated his exercise routine with ball and bands    Shoulder Exercises: Standing   Horizontal ABduction Strengthening;Both;15 reps;Theraband   Theraband Level (Shoulder Horizontal ABduction) Level 3 (Green)   External Rotation Strengthening;Both;10 reps;Theraband   Theraband Level (Shoulder External Rotation) Level 3 (Green)   Internal Rotation Strengthening;Both;10 reps;Theraband   Theraband Level (Shoulder Internal Rotation) Level 3 (Green)   Flexion Strengthening;Both;10 reps;Theraband   Theraband Level (Shoulder Flexion) Level 3 (Green)   Row Strengthening;Both;15 reps;Theraband   Theraband Level (Shoulder Row) Level 3 (Green)   Shoulder Exercises: ROM/Strengthening   UBE (Upper Arm Bike) 6 min level 1, both directions    Manual Therapy   Manual Therapy  Joint mobilization;Myofascial release;Passive ROM   Joint Mobilization scapular mobs , thoracic                 PT Education - 07/03/15 1530    Education provided Yes   Education Details HEP and posture, form, scapular mob   Person(s) Educated Patient   Methods Explanation;Demonstration   Comprehension Verbalized understanding;Returned demonstration             PT Long Term Goals - 07/01/15 0804    PT LONG TERM GOAL #1   Title Pt will be I with HEP for UE ROM, Strength and posture    Status On-going   PT LONG TERM GOAL #2   Title Pt will be able to wake with pain < 3 /10 and require no ice application >41% of the time   Status On-going   PT LONG TERM GOAL #3   Title Pt will be able to reach to 100 deg (for coffee cup) in AM with no increase in pain    Status On-going   PT LONG TERM GOAL #4   Title Pt will be able to do yardwork as needed without increase in pain    Status On-going   PT LONG TERM GOAL #5   Title Pt will be able to play golf with modifications and min increase in pain.    Status On-going               Plan - 07/03/15 1530    Clinical  Impression Statement Patient was excited to have had 2 days of no pain.  He was reminded to continue to use caution with daily actvities and not overdo it.  Will not mark goals as met to see if he remains pain free.    PT Next Visit Plan emphasize form and posture of neck, scap with exercises.  Consider ionto/tape? add [name]?   PT Home Exercise Plan horiz abd, ER/IR, and corner stretch    Consulted and Agree with Plan of Care Patient      Patient will benefit from skilled therapeutic intervention in order to improve the following deficits and impairments:  Pain, Postural dysfunction, Decreased strength, Impaired flexibility, Decreased range of motion, Increased fascial restricitons, Impaired UE functional use  Visit Diagnosis: Pain in left shoulder  Pain in right shoulder  Stiffness of right shoulder,  not elsewhere classified  Abnormal posture  Muscle weakness (generalized)  Stiffness of left shoulder, not elsewhere classified     Problem List Patient Active Problem List   Diagnosis Date Noted  . Osteoarthritis 02/02/2011  . Nicotine use disorder 02/02/2011  . Erectile dysfunction 02/02/2011    PAA,JENNIFER 07/03/2015, 3:32 PM  [facility] [address] [place], [place], [address] Phone: [phone]   Fax:  [phone]  Name: Maxwell Leonard MRN: [id number] Date of Birth: 07/18/1945   Raeford Razor, PT 07/03/2015 3:33 PM Phone: [phone] Fax: [phone]

## 2015-07-03 NOTE — Patient Instructions (Signed)
    Gave handout on posture, upper crossed syndrome and ADLs.

## 2015-07-08 ENCOUNTER — Ambulatory Visit: Payer: Federal, State, Local not specified - PPO | Attending: Orthopaedic Surgery | Admitting: Physical Therapy

## 2015-07-08 DIAGNOSIS — M25612 Stiffness of left shoulder, not elsewhere classified: Secondary | ICD-10-CM | POA: Insufficient documentation

## 2015-07-08 DIAGNOSIS — M6281 Muscle weakness (generalized): Secondary | ICD-10-CM | POA: Insufficient documentation

## 2015-07-08 DIAGNOSIS — R293 Abnormal posture: Secondary | ICD-10-CM | POA: Diagnosis present

## 2015-07-08 DIAGNOSIS — M25611 Stiffness of right shoulder, not elsewhere classified: Secondary | ICD-10-CM | POA: Insufficient documentation

## 2015-07-08 DIAGNOSIS — M25511 Pain in right shoulder: Secondary | ICD-10-CM | POA: Diagnosis present

## 2015-07-08 DIAGNOSIS — M25512 Pain in left shoulder: Secondary | ICD-10-CM | POA: Insufficient documentation

## 2015-07-08 NOTE — Therapy (Signed)
[place], [place], [address] Phone: [phone]   Fax:  [phone]  Physical Therapy Treatment  Patient Details  Name: Maxwell Leonard MRN: [id number] Date of Birth: 22-Feb-1946 Referring Provider: Dr. Joni Fears   Encounter Date: 07/08/2015      PT End of Session - 07/08/15 1320    Visit Number 4   Number of Visits 12   Date for PT Re-Evaluation 07/30/15   PT Start Time 0933   PT Stop Time 1014   PT Time Calculation (min) 41 min   Activity Tolerance Patient tolerated treatment well   Behavior During Therapy [facility] for tasks assessed/performed      Past Medical History  Diagnosis Date  . Arthritis   . Kidney stones 2011    had kidney stones spring of the year and then previously 20 yrs     Past Surgical History  Procedure Laterality Date  . Tonsillectomy      There were no vitals filed for this visit.      Subjective Assessment - 07/08/15 0935    Subjective Still no pain.  Working in the yard and took [employer].  Trying to wean myself off of Celebrex.     Currently in Pain? No/denies                         [facility] Adult PT Treatment/Exercise - 07/08/15 0943    Shoulder Exercises: Standing   External Rotation Strengthening;Both;10 reps   Theraband Level (Shoulder External Rotation) Level 3 (Green)   External Rotation Weight (lbs) Worked at diff angles of abduction seated elbow on table    Flexion AAROM;Both;10 reps   ABduction AAROM;Both;10 reps   Extension AAROM;Both;10 reps   Row Strengthening   Theraband Level (Shoulder Row) Other (comment)   Row Weight (lbs) Omega 25 lbs 2 sets x 15 wide and narrow grip    Retraction Both;5 reps   Other Standing Exercises standing cane see above: added IR slides up back with cane x 10    Other Standing Exercises Lat pull down x 15 Omega 25 lbs    Shoulder Exercises: ROM/Strengthening   UBE (Upper Arm Bike) 5 min L 5 in reverse    Shoulder Exercises: Stretch   Corner Stretch 3 reps;30 seconds   Shoulder Exercises: Body Blade   Flexion 15 seconds;3 reps   ABduction 15 seconds;3 reps   External Rotation 15 seconds;2 reps   Other Body Blade Exercises very challenging for patient, difficulty with grip and coordinating non dominant hand                     PT Long Term Goals - 07/08/15 0959    PT LONG TERM GOAL #1   Title Pt will be I with HEP for UE ROM, Strength and posture    Status On-going   PT LONG TERM GOAL #2   Title Pt will be able to wake with pain < 3 /10 and require no ice application >96% of the time   Status On-going   PT LONG TERM GOAL #3   Title Pt will be able to reach to 100 deg (for coffee cup) in AM with no increase in pain    Status Achieved   PT LONG TERM GOAL #4   Title Pt will be able to do yardwork as needed without increase in pain    Status Partially Met   PT LONG  TERM GOAL #5   Title Pt will be able to play golf with modifications and min increase in pain.    Status On-going               Plan - 07/08/15 1321    Clinical Impression Statement Cont to have min to no pain.  Advised her check with MD about Dc his Celebrex.  Swollen feeling in hands has subsided, too.  He plans to finish within the next few visits. He is benefitting from mod verbal and tactile cues for technique, alignment and carryover of principles into his exercises and daily activity.    PT Next Visit Plan emphasize form and posture of neck, scap with exercises.  Consider ionto/tape? add [name]?   PT Home Exercise Plan horiz abd, ER/IR, and corner stretch    Consulted and Agree with Plan of Care Patient      Patient will benefit from skilled therapeutic intervention in order to improve the following deficits and impairments:  Pain, Postural dysfunction, Decreased strength, Impaired flexibility, Decreased range of motion, Increased fascial restricitons, Impaired UE functional use  Visit  Diagnosis: Pain in left shoulder  Pain in right shoulder  Stiffness of right shoulder, not elsewhere classified  Abnormal posture  Muscle weakness (generalized)  Stiffness of left shoulder, not elsewhere classified     Problem List Patient Active Problem List   Diagnosis Date Noted  . Osteoarthritis 02/02/2011  . Nicotine use disorder 02/02/2011  . Erectile dysfunction 02/02/2011    Maxwell Leonard 07/08/2015, 1:23 PM  [facility] [address] [place], [place], [address] Phone: [phone]   Fax:  [phone]  Name: Maxwell Leonard MRN: [id number] Date of Birth: 01-10-1946    Raeford Razor, PT 07/08/2015 1:23 PM Phone: [phone] Fax: [phone]

## 2015-07-12 ENCOUNTER — Ambulatory Visit: Payer: Federal, State, Local not specified - PPO | Admitting: Physical Therapy

## 2015-07-12 DIAGNOSIS — M25512 Pain in left shoulder: Secondary | ICD-10-CM

## 2015-07-12 DIAGNOSIS — M25511 Pain in right shoulder: Secondary | ICD-10-CM

## 2015-07-12 DIAGNOSIS — M25611 Stiffness of right shoulder, not elsewhere classified: Secondary | ICD-10-CM

## 2015-07-12 DIAGNOSIS — R293 Abnormal posture: Secondary | ICD-10-CM

## 2015-07-12 NOTE — Therapy (Signed)
Maxwell Leonard, [place], [address] Phone: [phone]   Fax:  [phone]  Physical Therapy Treatment  Patient Details  Name: Maxwell Leonard MRN: [id number] Date of Birth: 1945/04/25 Referring Provider: Dr. Joni Fears   Encounter Date: 07/12/2015      PT End of Session - 07/12/15 1009    Visit Number 5   Number of Visits 12   Date for PT Re-Evaluation 07/30/15   PT Start Time 0932   PT Stop Time 1020   PT Time Calculation (min) 48 min   Activity Tolerance Patient tolerated treatment well   Behavior During Therapy [facility] for tasks assessed/performed      Past Medical History  Diagnosis Date  . Arthritis   . Kidney stones 2011    had kidney stones spring of the year and then previously 20 yrs     Past Surgical History  Procedure Laterality Date  . Tonsillectomy      There were no vitals filed for this visit.      Subjective Assessment - 07/12/15 0938    Subjective Soreness today, rated 6/10 ant shoulder Rt.  None in L UE. Reports lifting sod Sunday which he attributes as the cause.    Currently in Pain? Yes   Pain Score 6    Pain Location Shoulder   Pain Orientation Right   Pain Descriptors / Indicators Sore   Pain Type Chronic pain   Pain Onset More than a month ago   Pain Frequency Intermittent   Aggravating Factors  overdoing    Pain Relieving Factors ice, meds, rest           [facility] Adult PT Treatment/Exercise - 07/12/15 0942    Shoulder Exercises: Sidelying   Other Sidelying Exercises sleeper stretch x 5 , 10 sec each side   Shoulder Exercises: Standing   External Rotation Strengthening;Both;10 reps   Theraband Level (Shoulder External Rotation) Level 2 (Red)   External Rotation Weight (lbs) 5 sec hold against the wall for posture   eccentric IR , band unattached    Shoulder Exercises: Pulleys   Flexion 2 minutes   ABduction 2 minutes   ABduction Limitations cues for thumb up  and relax neck    Shoulder Exercises: ROM/Strengthening   UBE (Upper Arm Bike) 6 min L 1 retro   Ball on Wall foam roller, elbow on roller for stretch and thoracic ext    Other ROM/Strengthening Exercises standing against wall with foam roller scapular retraction x 10, UE elevation x 10    Moist Heat Therapy   Number Minutes Moist Heat 15 Minutes   Moist Heat Location Shoulder  R   Manual Therapy   Manual Therapy Passive ROM   Joint Mobilization scapular mobs , thoracic    Passive ROM Rt. UE all planes                 PT Education - 07/12/15 1014    Education provided Yes   Education Details scapular mobility, tight post capsule and sleeper stretch    Person(s) Educated Patient   Methods Explanation   Comprehension Verbalized understanding;Returned demonstration             PT Long Term Goals - 07/08/15 0959    PT LONG TERM GOAL #1   Title Pt will be I with HEP for UE ROM, Strength and posture    Status On-going   PT LONG TERM GOAL #2   Title Pt  will be able to wake with pain < 3 /10 and require no ice application >37% of the time   Status On-going   PT LONG TERM GOAL #3   Title Pt will be able to reach to 100 deg (for coffee cup) in AM with no increase in pain    Status Achieved   PT LONG TERM GOAL #4   Title Pt will be able to do yardwork as needed without increase in pain    Status Partially Met   PT LONG TERM GOAL #5   Title Pt will be able to play golf with modifications and min increase in pain.    Status On-going               Plan - 07/12/15 1201    Clinical Impression Statement Pt with increased soreness compared with last visit.  Pt tends to overdo his activity.  Addressed posture with strengthening and mobiliy exercises.  No further goals met.     PT Next Visit Plan emphasize form and posture of neck, scap with exercises.  Consider ionto/tape? add [name]?   PT Home Exercise Plan horiz abd, ER/IR, and corner stretch    Consulted and  Agree with Plan of Care Patient      Patient will benefit from skilled therapeutic intervention in order to improve the following deficits and impairments:  Pain, Postural dysfunction, Decreased strength, Impaired flexibility, Decreased range of motion, Increased fascial restricitons, Impaired UE functional use  Visit Diagnosis: Pain in left shoulder  Pain in right shoulder  Stiffness of right shoulder, not elsewhere classified  Abnormal posture     Problem List Patient Active Problem List   Diagnosis Date Noted  . Osteoarthritis 02/02/2011  . Nicotine use disorder 02/02/2011  . Erectile dysfunction 02/02/2011    Arelys Glassco 07/12/2015, 12:10 PM  [facility] [address] [place], [place], [address] Phone: [phone]   Fax:  [phone]  Name: Maxwell Leonard MRN: [id number] Date of Birth: 1946-01-20    Raeford Razor, PT 07/12/2015 12:10 PM Phone: [phone] Fax: [phone]

## 2015-07-12 NOTE — Patient Instructions (Signed)
Posterior Capsule Sleeper Stretch, Side-Lying    Lie on side, pillow under head, neck in neutral, underside arm in 90-90 of shoulder and elbow flexion with scapula fixed to table. Use other hand to press back of underside arm forward and downward. Keep elbow angle. Hold _10__ seconds.  Repeat __5_ times per session. Do _1__ sessions per day.   Copyright  VHI. All rights reserved.

## 2015-07-15 ENCOUNTER — Ambulatory Visit: Payer: Federal, State, Local not specified - PPO | Admitting: Physical Therapy

## 2015-07-15 DIAGNOSIS — M6281 Muscle weakness (generalized): Secondary | ICD-10-CM

## 2015-07-15 DIAGNOSIS — M25511 Pain in right shoulder: Secondary | ICD-10-CM

## 2015-07-15 DIAGNOSIS — M25512 Pain in left shoulder: Secondary | ICD-10-CM

## 2015-07-15 DIAGNOSIS — R293 Abnormal posture: Secondary | ICD-10-CM

## 2015-07-15 DIAGNOSIS — M25611 Stiffness of right shoulder, not elsewhere classified: Secondary | ICD-10-CM

## 2015-07-15 DIAGNOSIS — M25612 Stiffness of left shoulder, not elsewhere classified: Secondary | ICD-10-CM

## 2015-07-15 NOTE — Therapy (Signed)
[place], [place], [address] Phone: [phone]   Fax:  [phone]  Physical Therapy Treatment  Patient Details  Name: JODEY BURBANO MRN: [id number] Date of Birth: 14-Jan-1946 Referring Provider: Dr. Joni Fears   Encounter Date: 07/15/2015      PT End of Session - 07/15/15 0805    Visit Number 6   Number of Visits 12   Date for PT Re-Evaluation 07/30/15   PT Start Time 0803   PT Stop Time 0850   PT Time Calculation (min) 47 min   Activity Tolerance Patient tolerated treatment well   Behavior During Therapy [facility] for tasks assessed/performed      Past Medical History  Diagnosis Date  . Arthritis   . Kidney stones 2011    had kidney stones spring of the year and then previously 20 yrs     Past Surgical History  Procedure Laterality Date  . Tonsillectomy      There were no vitals filed for this visit.      Subjective Assessment - 07/15/15 0806    Subjective Rt. shoulder sore, was out in the yard alot this weekend.  L one is OK.     Currently in Pain? Yes   Pain Score 5    Pain Location Shoulder   Pain Orientation Right   Pain Descriptors / Indicators Sore   Pain Type Chronic pain   Pain Onset More than a month ago             Pilates Reformer used for LE/core strength, postural strength, lumbopelvic disassociation and core control.  Exercises included: Overhead press 1 red double and single arm.  Pt unable to relax upper traps and maintain scapular stability during ex.  Decreased AROM and multiple cues for neck, chin tuck.  Pulling straps 1 Red, mod to 1 blue for weakness and to get full AROM. Cues for thoracic extension.  Shoulder ext, T press and needed to stop due to pain and fatigue.               [facility] Adult PT Treatment/Exercise - 07/15/15 0827    Shoulder Exercises: Prone   Retraction Strengthening;Both;10 reps   Extension Strengthening;Both;10 reps   Horizontal ABduction 1 Strengthening;Both;10 reps   Horizontal ABduction 1 Weight (lbs) elbows bent    Other Prone Exercises [name] ex over ball on floor added tp HEP    Cryotherapy   Number Minutes Cryotherapy 8 Minutes   Cryotherapy Location Shoulder  R   Type of Cryotherapy Ice pack     Prone shoulder ex done on both ball and on mat with towel under forehead, 2 sets x 10 each.            PT Education - 07/15/15 [phone]    Education provided Yes   Education Details posture, Pilates, [name] HEP with ball    Person(s) Educated Patient   Methods Explanation;Demonstration;Tactile cues;Verbal cues;Handout   Comprehension Verbalized understanding;Verbal cues required;Tactile cues required;Need further instruction             PT Long Term Goals - 07/15/15 1526    PT LONG TERM GOAL #1   Title Pt will be I with HEP for UE ROM, Strength and posture    Status On-going   PT LONG TERM GOAL #2   Title Pt will be able to wake with pain < 3 /10 and require no ice application >84% of the time   Baseline some days  Status Partially Met   PT LONG TERM GOAL #3   Title Pt will be able to reach to 100 deg (for coffee cup) in AM with no increase in pain    Status Achieved   PT LONG TERM GOAL #4   Title Pt will be able to do yardwork as needed without increase in pain    Status On-going   PT LONG TERM GOAL #5   Title Pt will be able to play golf with modifications and min increase in pain.    Status On-going               Plan - 07/15/15 1530    Clinical Impression Statement Pt frusrated with return of soreness in Rt UE.  REinforced the fact that he was active and did a heavy amount of yardwork.  Postural weakness evident toay in prone.  Pt to add [name] to his HEP with ball to correct.  Cues throughout session for posture, neck position and technique.  May extend another week for reinforcement.    PT Next Visit Plan emphasize form and posture of neck, scap with  exercises.  Consider ionto/tape to upper traps    PT Home Exercise Plan horiz abd, ER/IR, and corner stretch , [name] prone scap   Consulted and Agree with Plan of Care Patient      Patient will benefit from skilled therapeutic intervention in order to improve the following deficits and impairments:  Pain, Postural dysfunction, Decreased strength, Impaired flexibility, Decreased range of motion, Increased fascial restricitons, Impaired UE functional use  Visit Diagnosis: Pain in left shoulder  Pain in right shoulder  Stiffness of right shoulder, not elsewhere classified  Abnormal posture  Muscle weakness (generalized)  Stiffness of left shoulder, not elsewhere classified     Problem List Patient Active Problem List   Diagnosis Date Noted  . Osteoarthritis 02/02/2011  . Nicotine use disorder 02/02/2011  . Erectile dysfunction 02/02/2011    Cody Albus 07/15/2015, 3:32 PM  [facility] [address] [place], [place], [address] Phone: [phone]   Fax:  [phone]  Name: OLVIN ROHR MRN: [id number] Date of Birth: 1945-07-24    Raeford Razor, PT 07/15/2015 3:32 PM Phone: [phone] Fax: [phone]

## 2015-07-17 ENCOUNTER — Ambulatory Visit: Payer: Federal, State, Local not specified - PPO | Admitting: Physical Therapy

## 2015-07-17 DIAGNOSIS — M25611 Stiffness of right shoulder, not elsewhere classified: Secondary | ICD-10-CM

## 2015-07-17 DIAGNOSIS — M25511 Pain in right shoulder: Secondary | ICD-10-CM

## 2015-07-17 DIAGNOSIS — M25512 Pain in left shoulder: Secondary | ICD-10-CM

## 2015-07-17 DIAGNOSIS — M25612 Stiffness of left shoulder, not elsewhere classified: Secondary | ICD-10-CM

## 2015-07-17 DIAGNOSIS — M6281 Muscle weakness (generalized): Secondary | ICD-10-CM

## 2015-07-17 DIAGNOSIS — R293 Abnormal posture: Secondary | ICD-10-CM

## 2015-07-17 NOTE — Therapy (Signed)
[place], [place], [address] Phone: [phone]   Fax:  [phone]  Physical Therapy Treatment  Patient Details  Name: Maxwell Leonard MRN: [id number] Date of Birth: 05-16-1945 Referring Provider: Dr. Joni Fears   Encounter Date: 07/17/2015      PT End of Session - 07/17/15 0843    Visit Number 7   Number of Visits 12   Date for PT Re-Evaluation 07/30/15   PT Start Time 0802   PT Stop Time 0844   PT Time Calculation (min) 42 min   Activity Tolerance Patient tolerated treatment well   Behavior During Therapy [facility] for tasks assessed/performed      Past Medical History  Diagnosis Date  . Arthritis   . Kidney stones 2011    had kidney stones spring of the year and then previously 20 yrs     Past Surgical History  Procedure Laterality Date  . Tonsillectomy      There were no vitals filed for this visit.      Subjective Assessment - 07/17/15 0806    Subjective I'm so tired of this.  Its a little better than the other day.     Currently in Pain? Yes   Pain Score 4    Pain Location Shoulder   Pain Orientation Right   Pain Descriptors / Indicators Sore   Pain Type Chronic pain   Pain Onset More than a month ago   Pain Frequency Intermittent   Aggravating Factors  unsure, may the exercises   Pain Relieving Factors ice, Meds, rest           [facility] Adult PT Treatment/Exercise - 07/17/15 0814    Shoulder Exercises: Therapy Ball   Other Therapy Ball Exercises prone over ball: ext, low abduction, horiz abd and W x 10 each cues for thoracic ext    Cryotherapy   Number Minutes Cryotherapy 10 Minutes   Cryotherapy Location Shoulder  R   Ultrasound   Ultrasound Location Rt. ant shoulder/superior    Ultrasound Parameters 1.0 W/cm 2, 1 MHz , 50% duty    Ultrasound Goals Pain   Manual Therapy   Manual Therapy Soft tissue mobilization   Joint Mobilization Inferior glide Gr 2 and A/P to  humeral head for repositioning    Soft tissue mobilization Rt. biceps, ant deltoid, Rt. upper trap and levator, used biofreeze  pec stretching R   Passive ROM Rt. UE                 PT Education - 07/17/15 1256    Education provided Yes   Education Details reduce frequency and intensity of ex   Person(s) Educated Patient   Methods Explanation   Comprehension Verbalized understanding             PT Long Term Goals - 07/15/15 1526    PT LONG TERM GOAL #1   Title Pt will be I with HEP for UE ROM, Strength and posture    Status On-going   PT LONG TERM GOAL #2   Title Pt will be able to wake with pain < 3 /10 and require no ice application >69% of the time   Baseline some days    Status Partially Met   PT LONG TERM GOAL #3   Title Pt will be able to reach to 100 deg (for coffee cup) in AM with no increase in pain    Status Achieved   PT LONG TERM  GOAL #4   Title Pt will be able to do yardwork as needed without increase in pain    Status On-going   PT LONG TERM GOAL #5   Title Pt will be able to play golf with modifications and min increase in pain.    Status On-going               Plan - 07/17/15 1257    Clinical Impression Statement Pt still moderately sore in Rt. shoulder.  Addressed inflammation and posture with more passive modalities today as he had alerady done his HEP this AM.  Considering getting a corstisone injection when he next sees Dr. (no appt made).     PT Next Visit Plan emphasize form and posture of neck, scap with exercises.  Assess massage and [place] with biofreeze , /tape to upper traps    PT Home Exercise Plan horiz abd, ER/IR, and corner stretch , [name] prone scap   Consulted and Agree with Plan of Care Patient      Patient will benefit from skilled therapeutic intervention in order to improve the following deficits and impairments:  Pain, Postural dysfunction, Decreased strength, Impaired flexibility, Decreased range of motion,  Increased fascial restricitons, Impaired UE functional use  Visit Diagnosis: Pain in left shoulder  Pain in right shoulder  Stiffness of right shoulder, not elsewhere classified  Abnormal posture  Muscle weakness (generalized)  Stiffness of left shoulder, not elsewhere classified     Problem List Patient Active Problem List   Diagnosis Date Noted  . Osteoarthritis 02/02/2011  . Nicotine use disorder 02/02/2011  . Erectile dysfunction 02/02/2011    PAA,JENNIFER 07/17/2015, 1:02 PM  [facility] [address] [place], [place], [address] Phone: [phone]   Fax:  [phone]  Name: Maxwell Leonard MRN: [id number] Date of Birth: 1945-10-19    Raeford Razor, PT 07/17/2015 1:02 PM Phone: [phone] Fax: [phone]

## 2015-07-23 ENCOUNTER — Ambulatory Visit: Payer: Federal, State, Local not specified - PPO | Admitting: Physical Therapy

## 2015-07-23 DIAGNOSIS — M25511 Pain in right shoulder: Secondary | ICD-10-CM

## 2015-07-23 DIAGNOSIS — M25512 Pain in left shoulder: Secondary | ICD-10-CM

## 2015-07-23 DIAGNOSIS — R293 Abnormal posture: Secondary | ICD-10-CM

## 2015-07-23 DIAGNOSIS — M25612 Stiffness of left shoulder, not elsewhere classified: Secondary | ICD-10-CM

## 2015-07-23 DIAGNOSIS — M25611 Stiffness of right shoulder, not elsewhere classified: Secondary | ICD-10-CM

## 2015-07-23 DIAGNOSIS — M6281 Muscle weakness (generalized): Secondary | ICD-10-CM

## 2015-07-24 NOTE — Therapy (Addendum)
[place], [place], [address] Phone: [phone]   Fax:  [phone]  Physical Therapy Treatment  Patient Details  Name: Maxwell Leonard MRN: [id number] Date of Birth: [birth year] Referring Provider: Dr. Joni Fears   Encounter Date: 07/23/2015      PT End of Session - 07/23/15 1634    Visit Number 8   Number of Visits 12   Date for PT Re-Evaluation 07/30/15   PT Start Time 4970   PT Stop Time 1632   PT Time Calculation (min) 46 min   Activity Tolerance Patient tolerated treatment well   Behavior During Therapy [facility] for tasks assessed/performed      Past Medical History  Diagnosis Date  . Arthritis   . Kidney stones 2011    had kidney stones spring of the year and then previously 20 yrs     Past Surgical History  Procedure Laterality Date  . Tonsillectomy      There were no vitals filed for this visit.      Subjective Assessment - 07/23/15 1551    Subjective no pain today. Was sore after my massage.  I'm going again today.  I want to work on the ball    Currently in Pain? No/denies                         [facility] Adult PT Treatment/Exercise - 07/23/15 1605    Exercises   Other Exercises  NuStep UE only 5 min level 7 15 sec speed intervals    Shoulder Exercises: Standing   Horizontal ABduction Strengthening;Both;15 reps;Theraband   Theraband Level (Shoulder Horizontal ABduction) Level 4 (Blue)   External Rotation Strengthening;Both;Theraband   Theraband Level (Shoulder External Rotation) Level 4 (Blue)   Flexion AAROM;Right;Other (comment)   Theraband Level (Shoulder Flexion) --  UE ranger    ABduction AAROM;Right;Limitations   Theraband Level (Shoulder ABduction) --  UE ranger    Other Standing Exercises lat pull blue x 10    Other Standing Exercises Freemotion: diagonal pull 1 plate , horiz abd 1 plate x 15 with 5 sec hold at the last rep, ER 1 plate each arm x 15  reps    Shoulder Exercises: Therapy Ball   Other Therapy Ball Exercises prone over ball: ext, low abduction, horiz abd and W x 10 each cues for thoracic ext   mod VCs    Shoulder Exercises: Stretch   Corner Stretch 2 reps;30 seconds   Ultrasound   Ultrasound Location Rt. ant shoulder    Ultrasound Parameters 1.0 W/cm2 1MHz, 50% used biofreeze   Ultrasound Goals Pain      Self Care: posture with exercise, impingment, pain vs soreness          PT Education - 07/23/15 1634    Education provided Yes   Education Details reinforced previous posture, decr intensity of ex and technique correction    Person(s) Educated Patient   Methods Explanation;Verbal cues;Tactile cues   Comprehension Verbalized understanding;Returned demonstration             PT Long Term Goals - 07/15/15 1526    PT LONG TERM GOAL #1   Title Pt will be I with HEP for UE ROM, Strength and posture    Status On-going   PT LONG TERM GOAL #2   Title Pt will be able to wake with pain < 3 /10 and require no ice application >26% of the time  Baseline some days    Status Partially Met   PT LONG TERM GOAL #3   Title Pt will be able to reach to 100 deg (for coffee cup) in AM with no increase in pain    Status Achieved   PT LONG TERM GOAL #4   Title Pt will be able to do yardwork as needed without increase in pain    Status On-going   PT LONG TERM GOAL #5   Title Pt will be able to play golf with modifications and min increase in pain.    Status On-going               Plan - 07/24/15 0803    Clinical Impression Statement Pt with min to no pain today, feels ready to manage symptoms on his own.  He will benefit from 1 more visit to reinforce concepts of posture and quality of movement vs intensity.  He is getting another massage tonight.     PT Next Visit Plan wrap up, DC/FOTO   PT Home Exercise Plan horiz abd, ER/IR, and corner stretch , [name] prone scap   Consulted and Agree with Plan of Care  Patient      Patient will benefit from skilled therapeutic intervention in order to improve the following deficits and impairments:  Pain, Postural dysfunction, Decreased strength, Impaired flexibility, Decreased range of motion, Increased fascial restricitons, Impaired UE functional use  Visit Diagnosis: Pain in left shoulder  Pain in right shoulder  Stiffness of right shoulder, not elsewhere classified  Abnormal posture  Muscle weakness (generalized)  Stiffness of left shoulder, not elsewhere classified     Problem List Patient Active Problem List   Diagnosis Date Noted  . Osteoarthritis 02/02/2011  . Nicotine use disorder 02/02/2011  . Erectile dysfunction 02/02/2011    PAA,JENNIFER 07/24/2015, 8:07 AM  [facility] [address] [place], [place], [address] Phone: [phone]   Fax:  [phone]  Name: Maxwell Leonard MRN: [id number] Date of Birth: Dec 03, 1945    Raeford Razor, PT 07/24/2015 8:07 AM Phone: [phone] Fax: [phone]   PHYSICAL THERAPY DISCHARGE SUMMARY  Visits from Start of Care: 8  Current functional level related to goals / functional outcomes: See above for function.    Remaining deficits: Posture, AROM, mild weakness   Education / Equipment: HEP, posture, props for exercise  Plan: Patient agrees to discharge.  Patient goals were partially met. Patient is being discharged due to the patient's request.  ?????    Patient cancelled last 3-4 visits and has not returned call made to him for an update.   Raeford Razor, PT 08/15/2015 2:54 PM Phone: [phone] Fax: [phone]

## 2015-07-25 ENCOUNTER — Ambulatory Visit: Payer: Federal, State, Local not specified - PPO | Admitting: Physical Therapy

## 2015-07-26 ENCOUNTER — Encounter: Payer: Federal, State, Local not specified - PPO | Admitting: Physical Therapy

## 2015-07-30 ENCOUNTER — Ambulatory Visit: Payer: Federal, State, Local not specified - PPO | Admitting: Physical Therapy

## 2016-01-20 ENCOUNTER — Encounter: Payer: Self-pay | Admitting: Family Medicine

## 2016-01-20 ENCOUNTER — Ambulatory Visit (INDEPENDENT_AMBULATORY_CARE_PROVIDER_SITE_OTHER): Payer: Federal, State, Local not specified - PPO | Admitting: Family Medicine

## 2016-01-20 VITALS — BP 132/78 | HR 84 | Temp 98.5°F | Ht 65.5 in | Wt 170.0 lb

## 2016-01-20 DIAGNOSIS — J441 Chronic obstructive pulmonary disease with (acute) exacerbation: Secondary | ICD-10-CM | POA: Diagnosis not present

## 2016-01-20 MED ORDER — DOXYCYCLINE HYCLATE 100 MG PO CAPS: 100.0000 mg | ORAL_CAPSULE | Freq: Two times a day (BID) | ORAL | 0 refills | Status: DC

## 2016-01-20 MED ORDER — PREDNISONE 20 MG PO TABS: ORAL_TABLET | ORAL | 0 refills | Status: DC

## 2016-01-20 MED ORDER — ALBUTEROL SULFATE HFA 108 (90 BASE) MCG/ACT IN AERS: 2.0000 | INHALATION_SPRAY | Freq: Four times a day (QID) | RESPIRATORY_TRACT | 2 refills | Status: DC | PRN

## 2016-01-20 NOTE — Patient Instructions (Signed)
Follow up for any fever or increased shortness of breath. 

## 2016-01-20 NOTE — Progress Notes (Signed)
Subjective:     Patient ID: Maxwell Leonard, male   DOB: January 20, 1946, 70 y.o.   MRN: [id number]  HPI Patient send a two-week history of cough. He has on one nicotine use and COPD. Still smokes about a third pack of cigarettes per day. Does not use any regular inhalers. He states his had productive cough mostly of clear mucus past couple weeks occasional yellow tinged. No fever. He was able rate clear about 2 hours yesterday without much difficulty. No dyspnea at rest. No nausea or vomiting.  Past Medical History:  Diagnosis Date  . Arthritis   . Kidney stones 2011   had kidney stones spring of the year and then previously 20 yrs    Past Surgical History:  Procedure Laterality Date  . TONSILLECTOMY      reports that he has been smoking Cigarettes.  He has been smoking about 0.50 packs per day. He has never used smokeless tobacco. He reports that he drinks alcohol. He reports that he does not use drugs. family history includes Alzheimer's disease in his mother; Cancer (age of onset: 28) in his father; Healthy in his daughter and son. No Known Allergies   Review of Systems  Constitutional: Negative for chills and fever.  HENT: Positive for congestion.   Respiratory: Positive for cough.   Cardiovascular: Negative for chest pain, palpitations and leg swelling.       Objective:   Physical Exam  Constitutional: He appears well-developed and well-nourished.  HENT:  Right Ear: External ear normal.  Left Ear: External ear normal.  Mouth/Throat: Oropharynx is clear and moist.  Neck: Neck supple.  Cardiovascular: Normal rate and regular rhythm.   Pulmonary/Chest:  Slightly diminished breath sounds throughout but no active wheezes. No retractions. No rales.  Musculoskeletal: He exhibits no edema.       Assessment:     Acute exacerbation of COPD    Plan:     -Doxycycline 100 mg twice a day for 10 days -Prednisone 20 mg 2 tablets daily for 5 days -Continue plain over-the-counter  Mucinex -Refill Ventolin for as needed use -He is encouraged to stop smoking -Follow-up immediately for any fever or worsening dyspnea  Eulas Post MD [facility]

## 2016-02-27 ENCOUNTER — Other Ambulatory Visit (INDEPENDENT_AMBULATORY_CARE_PROVIDER_SITE_OTHER): Payer: Self-pay | Admitting: Orthopaedic Surgery

## 2016-05-14 ENCOUNTER — Telehealth (INDEPENDENT_AMBULATORY_CARE_PROVIDER_SITE_OTHER): Payer: Self-pay | Admitting: Orthopaedic Surgery

## 2016-05-14 NOTE — Telephone Encounter (Signed)
Please advise 

## 2016-05-14 NOTE — Telephone Encounter (Signed)
Patient is [facility] [address] [address]

## 2016-05-19 ENCOUNTER — Other Ambulatory Visit (INDEPENDENT_AMBULATORY_CARE_PROVIDER_SITE_OTHER): Payer: Self-pay

## 2016-05-19 MED ORDER — CELECOXIB 200 MG PO CAPS: 200.0000 mg | ORAL_CAPSULE | Freq: Every day | ORAL | 0 refills | Status: DC

## 2016-05-19 NOTE — Telephone Encounter (Signed)
done

## 2016-05-19 NOTE — Telephone Encounter (Signed)
Ok celebrex  200mg  #60 1 po qd with food   Ref 1

## 2016-07-21 ENCOUNTER — Encounter (INDEPENDENT_AMBULATORY_CARE_PROVIDER_SITE_OTHER): Payer: Self-pay | Admitting: Orthopaedic Surgery

## 2016-07-21 ENCOUNTER — Ambulatory Visit (INDEPENDENT_AMBULATORY_CARE_PROVIDER_SITE_OTHER): Payer: Federal, State, Local not specified - PPO | Admitting: Orthopaedic Surgery

## 2016-07-21 ENCOUNTER — Ambulatory Visit (INDEPENDENT_AMBULATORY_CARE_PROVIDER_SITE_OTHER): Payer: Self-pay

## 2016-07-21 ENCOUNTER — Other Ambulatory Visit (INDEPENDENT_AMBULATORY_CARE_PROVIDER_SITE_OTHER): Payer: Self-pay | Admitting: Orthopaedic Surgery

## 2016-07-21 VITALS — BP 130/83 | HR 85 | Ht 70.0 in | Wt 175.0 lb

## 2016-07-21 DIAGNOSIS — M25561 Pain in right knee: Secondary | ICD-10-CM

## 2016-07-21 DIAGNOSIS — G8929 Other chronic pain: Secondary | ICD-10-CM | POA: Diagnosis not present

## 2016-07-21 DIAGNOSIS — M25551 Pain in right hip: Secondary | ICD-10-CM | POA: Diagnosis not present

## 2016-07-21 MED ORDER — METHYLPREDNISOLONE 4 MG PO TBPK: ORAL_TABLET | ORAL | 0 refills | Status: DC

## 2016-07-21 NOTE — Progress Notes (Signed)
Office Visit Note   Patient: Maxwell Leonard           Date of Birth: April 22, 1945           MRN: [id number] Visit Date: 07/21/2016              Requested by: Maxwell Leonard [place], [place] [address] PCP: Maxwell Leonard   Assessment & Plan: Visit Diagnoses:  1. Chronic pain of right knee   2. Pain in right hip   No significant pathology identified by plain films of hip or knee. Pain is nondescript and somewhat diffuse particularly in the area of his right thigh and specifically at night. I wonder if some of his pain may not be referred from his lumbar spine  Plan: Medrol dose pack. Return to office in the next 2 weeks for reevaluation  Follow-Up Instructions: No Follow-up on file.   Orders:  Orders Placed This Encounter  Procedures  . XR Pelvis 1-2 Views  . XR KNEE 3 VIEW RIGHT   Meds ordered this encounter  Medications  . methylPREDNISolone (MEDROL DOSEPAK) 4 MG TBPK tablet    Sig: Take as directed per package instructions    Dispense:  21 tablet    Refill:  0      Procedures: No procedures performed   Clinical Data: No additional findings.   Subjective: Chief Complaint  Patient presents with  . Right Knee - Pain, Edema    Maxwell Leonard is  a 71 y o that presents with Right knee/R leg pain and groin pain x 1 week. He relates he also hsa groin pain and trouble sleeping. Takes a "shot" of whiskey to get to sleep. Pt been on Celebrex for extended periods of time.    Maxwell Leonard first noted insidious onset of right lower extremity pain in the last several weeks. He walks 1-2 miles in the morning up to 4 days a week and doesn't have any trouble. Able play golf without any problem. At night he is having a lot of trouble with pain in his right thigh to the point where he has to get up take medicine. He's been on Celebrex for well over a year 200 mg a day for various joint pain. That has not helped him at night. He denies any numbness or tingling  or obvious trauma. He doesn't have any trouble on the left side. He does not specifically have groin or knee pain but rather nondescript somewhat diffuse pain in the area of his left thigh.  HPI  Review of Systems   Objective: Vital Signs: BP 130/83   Pulse 85   Ht 5\' 10"  (1.778 m)   Wt 175 lb (79.4 kg)   BMI 25.11 kg/m   Physical Exam  Ortho Exam straight leg raise negative bilaterally. Reflexes are symmetrical. No pain range of motion of either hip with internal/external rotation. No localizing pain about either knee. No right knee effusion. Full extension and flexion over 105 without instability. No thigh pain. No calf pain. Neurovascular exam intact distally no swelling. No percussible tenderness of lumbar spine.  Specialty Comments:  No specialty comments available.  Imaging: Xr Knee 3 View Right  Result Date: 07/21/2016 3 views of the right knee were obtained in the AP lateral and patellar views standing. The AP view revealed no significant decrease in either joint space. No ectopic calcification. No osteophytes were identified. Lateral film reveals some calcification within the femoral artery. No  spurring about the patellofemoral joint. Patella appears to track in the midline with minimal osteophytes at the patellofemoral joint medially and laterally.  Xr Pelvis 1-2 Views  Result Date: 07/21/2016 AP the pelvis was obtained which fitting intact bilateral hip joints. No ectopic calcification. Bowel gas obliterate some of the detail but no obvious bony abnormality either hemipelvis. Considerable degenerative changes at L4-5 and L5-S1 on the AP    PMFS History: Patient Active Problem List   Diagnosis Date Noted  . Osteoarthritis 02/02/2011  . Nicotine use disorder 02/02/2011  . Erectile dysfunction 02/02/2011   Past Medical History:  Diagnosis Date  . Arthritis   . Kidney stones 2011   had kidney stones spring of the year and then previously 10 yrs     Family History   Problem Relation Age of Onset  . Healthy Daughter   . Healthy Son   . Alzheimer's disease Mother   . Cancer Father 77       lung cancer    Past Surgical History:  Procedure Laterality Date  . TONSILLECTOMY     Social History   Occupational History  . Not on file.   Social History Main Topics  . Smoking status: Current Every Day Smoker    Packs/day: 0.50    Types: Cigarettes  . Smokeless tobacco: Never Used  . Alcohol use Yes  . Drug use: No  . Sexual activity: Not on file     Maxwell Balding, Leonard   Note - This record has been created using [employer].  Chart creation errors have been sought, but may not always  have been located. Such creation errors do not reflect on  the standard of medical care.

## 2016-07-24 ENCOUNTER — Other Ambulatory Visit (INDEPENDENT_AMBULATORY_CARE_PROVIDER_SITE_OTHER): Payer: Self-pay

## 2016-07-24 MED ORDER — CELECOXIB 200 MG PO CAPS: 200.0000 mg | ORAL_CAPSULE | Freq: Every day | ORAL | 0 refills | Status: DC

## 2016-07-30 ENCOUNTER — Ambulatory Visit (INDEPENDENT_AMBULATORY_CARE_PROVIDER_SITE_OTHER): Payer: Federal, State, Local not specified - PPO | Admitting: Orthopaedic Surgery

## 2016-07-30 ENCOUNTER — Encounter (INDEPENDENT_AMBULATORY_CARE_PROVIDER_SITE_OTHER): Payer: Self-pay | Admitting: Orthopaedic Surgery

## 2016-07-30 VITALS — BP 120/64 | HR 85 | Ht 69.0 in | Wt 165.0 lb

## 2016-07-30 DIAGNOSIS — M79604 Pain in right leg: Secondary | ICD-10-CM | POA: Diagnosis not present

## 2016-07-30 NOTE — Progress Notes (Signed)
Office Visit Note   Patient: Maxwell Leonard           Date of Birth: 11/10/1945           MRN: [id number] Visit Date: 07/30/2016              Requested by: Eulas Post, MD [place], [place] [address] PCP: Eulas Post, MD   Assessment & Plan: Visit Diagnoses:  1. Pain in right leg   Pain in right leg probably related to the shingles. Presently doing well  Plan: Celebrex at night as the only time he has pain is during the evening or early in the morning, call: Another prescription for Medrol Dosepak. Office as needed  Follow-Up Instructions: Return if symptoms worsen or fail to improve.   Orders:  No orders of the defined types were placed in this encounter.  No orders of the defined types were placed in this encounter.     Procedures: No procedures performed   Clinical Data: No additional findings.   Subjective: No chief complaint on file. Taino was seen about 9 days ago for evaluation of right leg pain as previously outlined. He had some skin lesions on his back and right leg that was subsequently diagnosed as shingles by his dermatologist. He was placed on acyclovir. I placed him on a Medrol Dosepak. He immediately had relief of his pain. He is been off the Medrol Dosepak now about 3-4 days and 2 days off the acyclovir. 2 nights ago he had recurrent pain. None since. He is obviously concerned that the pain may recur as it really interferes with his ability to sleep. No back pain. No groin pain. No calf pain no neurovascular issues. Skin lesion sepsis almost resolved  HPI  Review of Systems   Objective: Vital Signs: BP 120/64   Pulse 85   Ht 5\' 9"  (1.753 m)   Wt 165 lb (74.8 kg)   BMI 24.37 kg/m   Physical Exam  Ortho Exam shingles lesions are almost completely resolved. No swelling in this right leg. Neurovascular exam is intact. Painless range of motion of right hip and right knee. Does not walk with a limp. Right leg raise  negative bilaterally. No percussible tenderness of the lumbar spine  Specialty Comments:  No specialty comments available.  Imaging: No results found.   PMFS History: Patient Active Problem List   Diagnosis Date Noted  . Osteoarthritis 02/02/2011  . Nicotine use disorder 02/02/2011  . Erectile dysfunction 02/02/2011   Past Medical History:  Diagnosis Date  . Arthritis   . Kidney stones 2011   had kidney stones spring of the year and then previously 9 yrs     Family History  Problem Relation Age of Onset  . Healthy Daughter   . Healthy Son   . Alzheimer's disease Mother   . Cancer Father 63       lung cancer    Past Surgical History:  Procedure Laterality Date  . TONSILLECTOMY     Social History   Occupational History  . Not on file.   Social History Main Topics  . Smoking status: Current Every Day Smoker    Packs/day: 0.50    Types: Cigarettes  . Smokeless tobacco: Never Used  . Alcohol use Yes  . Drug use: No  . Sexual activity: Not on file     Garald Balding, MD   Note - This record has been created using [employer].  Chart creation errors have been sought, but may not always  have been located. Such creation errors do not reflect on  the standard of medical care.

## 2016-07-31 ENCOUNTER — Other Ambulatory Visit (INDEPENDENT_AMBULATORY_CARE_PROVIDER_SITE_OTHER): Payer: Self-pay

## 2016-07-31 ENCOUNTER — Telehealth (INDEPENDENT_AMBULATORY_CARE_PROVIDER_SITE_OTHER): Payer: Self-pay | Admitting: Orthopaedic Surgery

## 2016-07-31 MED ORDER — METHYLPREDNISOLONE 4 MG PO TBPK: ORAL_TABLET | ORAL | 0 refills | Status: DC

## 2016-07-31 NOTE — Telephone Encounter (Signed)
Patient called inquiring about his Prednisone prescription that was to be called into his pharmacy yesterday.  [facility] [address] [facility] [address]  CB#[phone].  Thank you.

## 2016-07-31 NOTE — Telephone Encounter (Signed)
Called pharmacy and sent rx

## 2016-11-17 ENCOUNTER — Telehealth (INDEPENDENT_AMBULATORY_CARE_PROVIDER_SITE_OTHER): Payer: Self-pay | Admitting: Orthopaedic Surgery

## 2016-11-17 NOTE — Telephone Encounter (Signed)
Patient is requesting refill of celecoxib 200 mg [facility] [place] [facility]

## 2016-11-18 ENCOUNTER — Other Ambulatory Visit (INDEPENDENT_AMBULATORY_CARE_PROVIDER_SITE_OTHER): Payer: Self-pay

## 2016-11-18 MED ORDER — CELECOXIB 200 MG PO CAPS: 200.0000 mg | ORAL_CAPSULE | Freq: Every day | ORAL | 0 refills | Status: DC

## 2016-11-18 NOTE — Telephone Encounter (Signed)
OK per BP

## 2016-11-19 ENCOUNTER — Telehealth (INDEPENDENT_AMBULATORY_CARE_PROVIDER_SITE_OTHER): Payer: Self-pay | Admitting: Orthopaedic Surgery

## 2016-11-19 ENCOUNTER — Telehealth: Payer: Self-pay | Admitting: Rheumatology

## 2016-11-19 NOTE — Telephone Encounter (Signed)
Opened in error

## 2016-11-19 NOTE — Telephone Encounter (Signed)
[facility] [address] [facility] not [facility] rx for Celebrex. Please resend RX.

## 2016-11-23 ENCOUNTER — Other Ambulatory Visit (INDEPENDENT_AMBULATORY_CARE_PROVIDER_SITE_OTHER): Payer: Self-pay

## 2016-11-23 MED ORDER — CELECOXIB 200 MG PO CAPS: 200.0000 mg | ORAL_CAPSULE | Freq: Every day | ORAL | 0 refills | Status: DC

## 2016-11-23 NOTE — Telephone Encounter (Signed)
resent

## 2017-02-09 ENCOUNTER — Telehealth: Payer: Self-pay | Admitting: Family Medicine

## 2017-02-09 NOTE — Telephone Encounter (Signed)
[employer] for patient to receive Shingrix? He has [employer] (not Medicare).

## 2017-02-09 NOTE — Telephone Encounter (Signed)
ok 

## 2017-02-09 NOTE — Telephone Encounter (Signed)
Copied from [place]. Topic: Quick Communication - See Telephone Encounter >> Feb 09, 2017  8:37 AM Burnis Medin, NT wrote: CRM for notification. See Telephone encounter for: Pt. Is calling to see if he can get the shingles shot. Pt would like a call back.  02/09/17.

## 2017-02-10 ENCOUNTER — Encounter: Payer: Self-pay | Admitting: Family Medicine

## 2017-02-10 ENCOUNTER — Ambulatory Visit: Payer: Federal, State, Local not specified - PPO | Admitting: Family Medicine

## 2017-02-10 VITALS — BP 120/70 | HR 79 | Temp 97.9°F | Wt 171.2 lb

## 2017-02-10 DIAGNOSIS — J441 Chronic obstructive pulmonary disease with (acute) exacerbation: Secondary | ICD-10-CM | POA: Diagnosis not present

## 2017-02-10 DIAGNOSIS — F17200 Nicotine dependence, unspecified, uncomplicated: Secondary | ICD-10-CM

## 2017-02-10 MED ORDER — PREDNISONE 20 MG PO TABS: ORAL_TABLET | ORAL | 0 refills | Status: DC

## 2017-02-10 MED ORDER — DOXYCYCLINE HYCLATE 100 MG PO CAPS: 100.0000 mg | ORAL_CAPSULE | Freq: Two times a day (BID) | ORAL | 0 refills | Status: DC

## 2017-02-10 NOTE — Progress Notes (Signed)
Subjective:     Patient ID: Maxwell Leonard, male   DOB: November 13, 1945, 71 y.o.   MRN: [id number]  HPI Patient has history of ongoing nicotine use is seen as a work in with approximately 5 day history of progressive cough. Cough productive of clear to yellow sputum. No fever. Try some Mucinex without improvement. Still smokes but less than half pack cigarettes per day. No increased dyspnea. He had similar exacerbation about a year ago which improved on prednisone and doxycycline. Denies recent hemoptysis. No appetite or weight changes.  Past Medical History:  Diagnosis Date  . Arthritis   . Kidney stones 2011   had kidney stones spring of the year and then previously 20 yrs    Past Surgical History:  Procedure Laterality Date  . TONSILLECTOMY      reports that he has been smoking cigarettes.  He has been smoking about 0.50 packs per day. he has never used smokeless tobacco. He reports that he drinks alcohol. He reports that he does not use drugs. family history includes Alzheimer's disease in his mother; Cancer (age of onset: 30) in his father; Healthy in his daughter and son. No Known Allergies   Review of Systems  Constitutional: Negative for chills and fever.  HENT: Negative for congestion and sore throat.   Respiratory: Positive for cough. Negative for shortness of breath and wheezing.   Cardiovascular: Negative for chest pain.       Objective:   Physical Exam  Constitutional: He appears well-developed and well-nourished.  HENT:  Right Ear: External ear normal.  Left Ear: External ear normal.  Mouth/Throat: Oropharynx is clear and moist.  Cardiovascular: Normal rate and regular rhythm.  Pulmonary/Chest:  He has slightly diminished breath sounds throughout. No retractions. No respiratory distress. No rales.  Musculoskeletal: He exhibits no edema.  Neurological: He is alert.       Assessment:     Cough. Suspect acute exacerbation of COPD. Pulse oximetry 97% and patient in  no respiratory distress    Plan:     -He is encouraged to stop smoking completely -Continue Mucinex -Doxycycline 100 mg twice daily for 10 days -Prednisone 20 mg 2 tablets daily for 5 days -Follow-up promptly for any fever or increasing shortness of breath -Suggested he consider complete physical at some point this year  Eulas Post MD [facility]

## 2017-02-10 NOTE — Patient Instructions (Signed)
Follow up for any fever or increased shortness of breath. Consider physical at some point this year.

## 2017-02-11 NOTE — Telephone Encounter (Signed)
Patient placed on Shingrix wait list. I will contact patient to schedule when vaccine is available. Pt notified of instructions and verbalized understanding.

## 2017-03-11 ENCOUNTER — Ambulatory Visit (INDEPENDENT_AMBULATORY_CARE_PROVIDER_SITE_OTHER): Payer: Federal, State, Local not specified - PPO

## 2017-03-11 DIAGNOSIS — Z23 Encounter for immunization: Secondary | ICD-10-CM | POA: Diagnosis not present

## 2017-04-15 ENCOUNTER — Other Ambulatory Visit (INDEPENDENT_AMBULATORY_CARE_PROVIDER_SITE_OTHER): Payer: Self-pay

## 2017-04-15 ENCOUNTER — Telehealth (INDEPENDENT_AMBULATORY_CARE_PROVIDER_SITE_OTHER): Payer: Self-pay | Admitting: Orthopaedic Surgery

## 2017-04-15 MED ORDER — CELECOXIB 200 MG PO CAPS: 200.0000 mg | ORAL_CAPSULE | Freq: Every day | ORAL | 0 refills | Status: DC

## 2017-04-15 NOTE — Telephone Encounter (Signed)
OK ? Last rx 11/23/2017

## 2017-04-15 NOTE — Telephone Encounter (Signed)
ok 

## 2017-04-15 NOTE — Telephone Encounter (Signed)
Patient request a refill on [facility]x [facility] [address]. Please call if any concerns.

## 2017-05-20 ENCOUNTER — Ambulatory Visit (INDEPENDENT_AMBULATORY_CARE_PROVIDER_SITE_OTHER): Payer: Federal, State, Local not specified - PPO | Admitting: Family Medicine

## 2017-05-20 DIAGNOSIS — Z23 Encounter for immunization: Secondary | ICD-10-CM

## 2017-07-23 ENCOUNTER — Other Ambulatory Visit (INDEPENDENT_AMBULATORY_CARE_PROVIDER_SITE_OTHER): Payer: Self-pay | Admitting: Orthopaedic Surgery

## 2017-07-23 NOTE — Telephone Encounter (Signed)
Please advise 

## 2017-07-26 ENCOUNTER — Other Ambulatory Visit (INDEPENDENT_AMBULATORY_CARE_PROVIDER_SITE_OTHER): Payer: Self-pay | Admitting: Radiology

## 2017-07-26 MED ORDER — CELECOXIB 200 MG PO CAPS: 200.0000 mg | ORAL_CAPSULE | Freq: Every day | ORAL | 0 refills | Status: DC

## 2017-07-26 NOTE — Telephone Encounter (Signed)
Ok to prescribe

## 2017-07-26 NOTE — Telephone Encounter (Signed)
FAXED MED REFILL INTO PHARMACY

## 2017-07-26 NOTE — Telephone Encounter (Signed)
PLEASE ADVISE.

## 2017-11-25 IMAGING — MR MR SHOULDER*R* W/O CM
4 of 5 series · 29 of 40 positions shown · non-contrast
Comparison: None.

CLINICAL DATA: Right shoulder pain and soreness.

EXAM:
MRI OF THE RIGHT SHOULDER WITHOUT CONTRAST
TECHNIQUE: Multiplanar, multisequence MR imaging of the shoulder was performed.
No intravenous contrast was administered.

[Series 3: T2 fat-sat · axial · 4.0mm · 0.25mm/px · z∈[-19,+85]mm · 8 of 24 slices shown (1 of 3)]
[im 1/24]
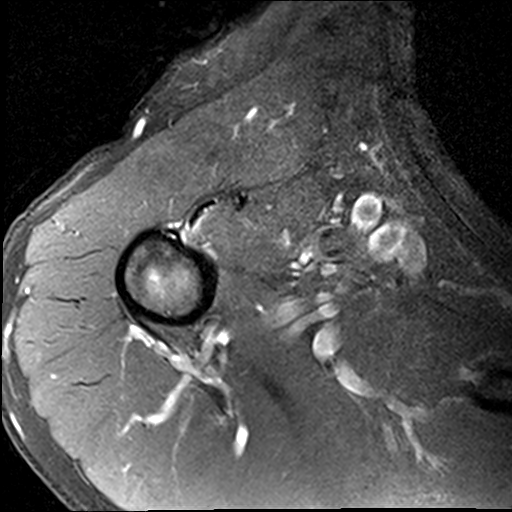
[im 3/24]
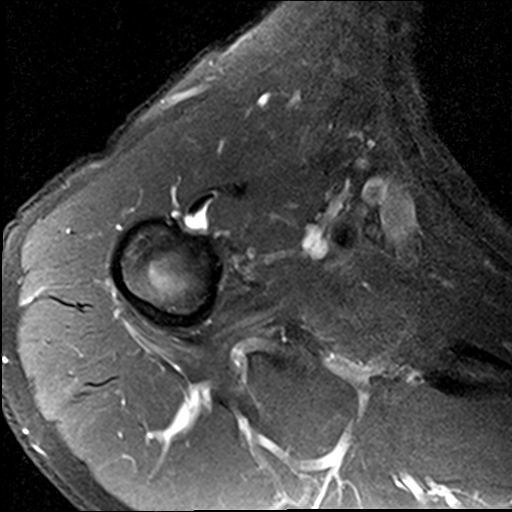
[im 8/24]
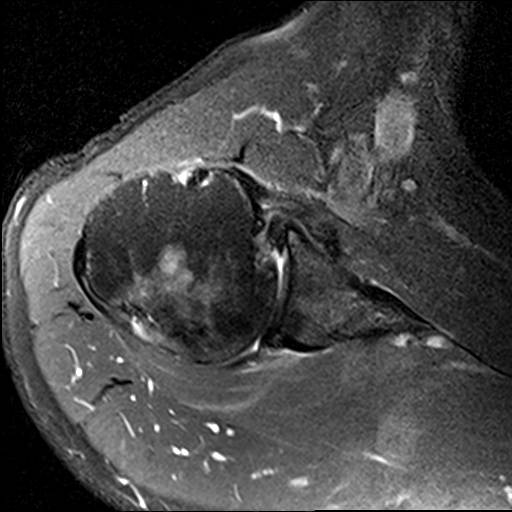
[im 11/24]
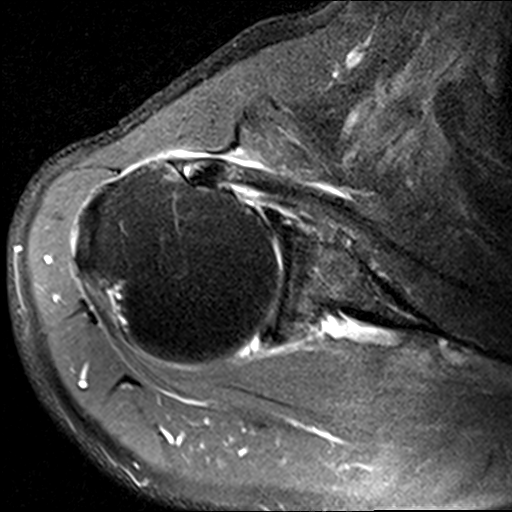
[im 13/24]
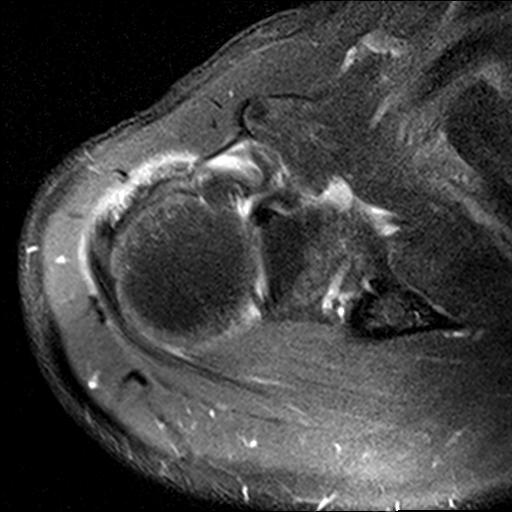
[im 16/24]
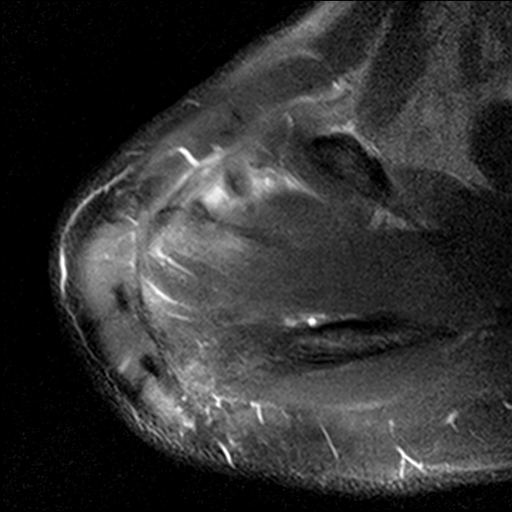
[im 21/24]
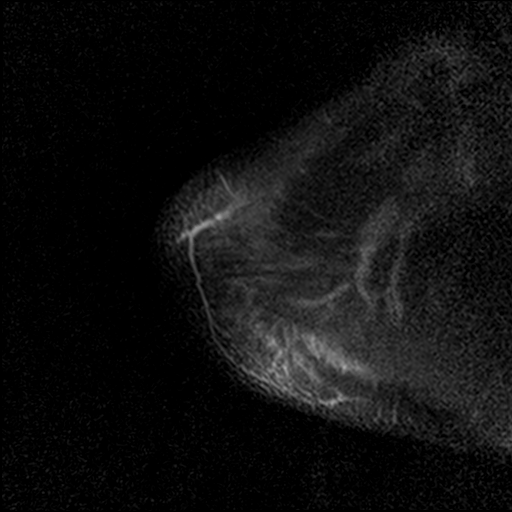
[im 24/24]
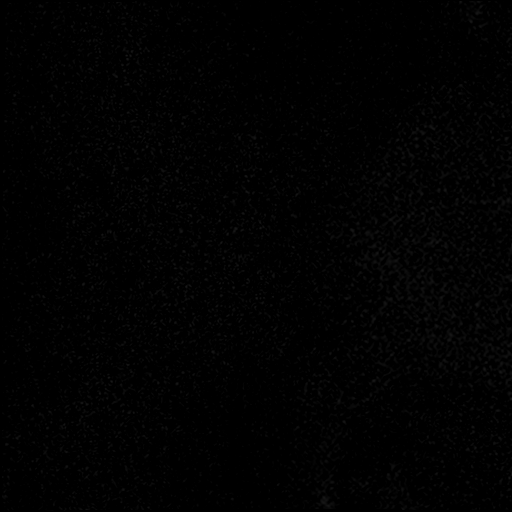

[Series 4: T2 fat-sat · oblique · 4.0mm · 0.55mm/px · 8 of 19 slices shown (2 of 3)]
[im 1/19]
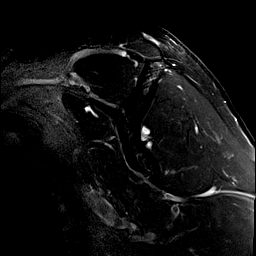
[im 3/19]
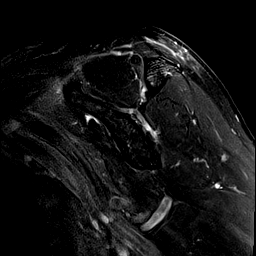
[im 6/19]
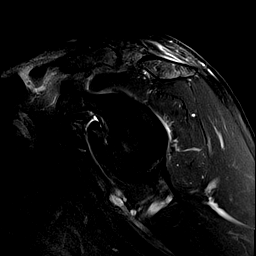
[im 8/19]
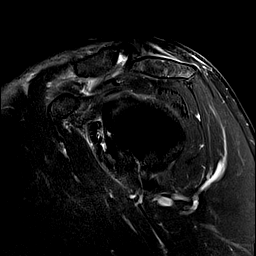
[im 11/19]
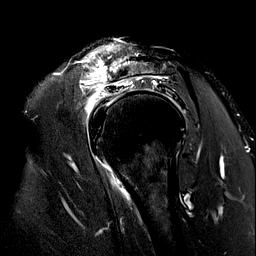
[im 13/19]
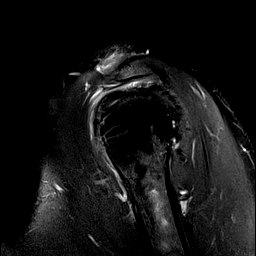
[im 16/19]
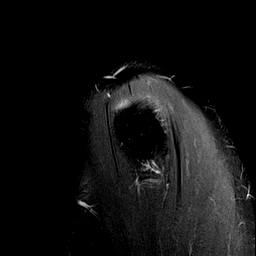
[im 19/19]
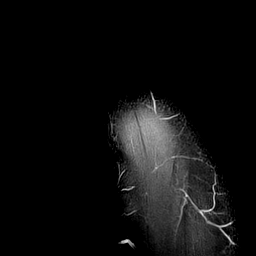

[Series 6: T2 fat-sat · oblique · 4.0mm · 0.55mm/px · 6 of 17 slices shown (3 of 3)]
[im 1/17]
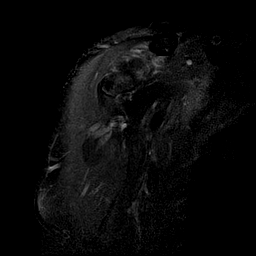
[im 3/17]
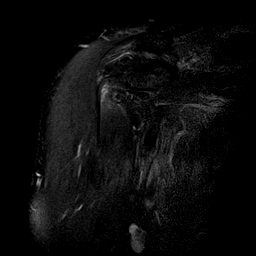
[im 6/17]
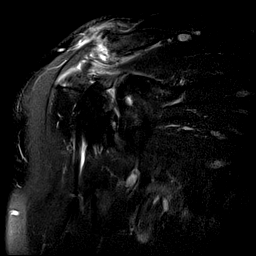
[im 9/17]
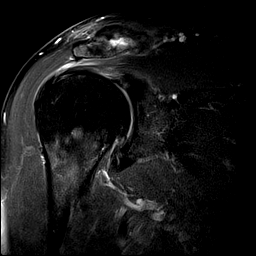
[im 11/17]
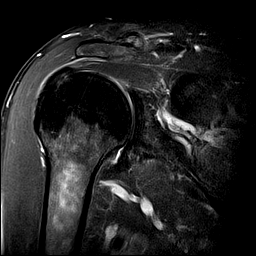
[im 14/17]
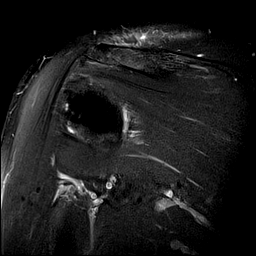

[Series 7: PD · oblique · 4.0mm · 0.27mm/px · 7 of 17 slices shown]
[im 1/17]
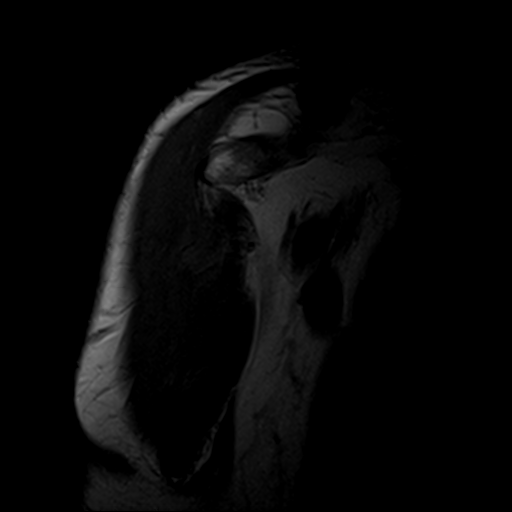
[im 3/17]
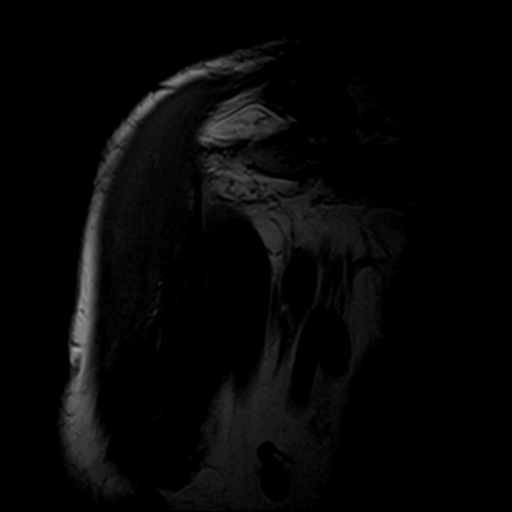
[im 6/17]
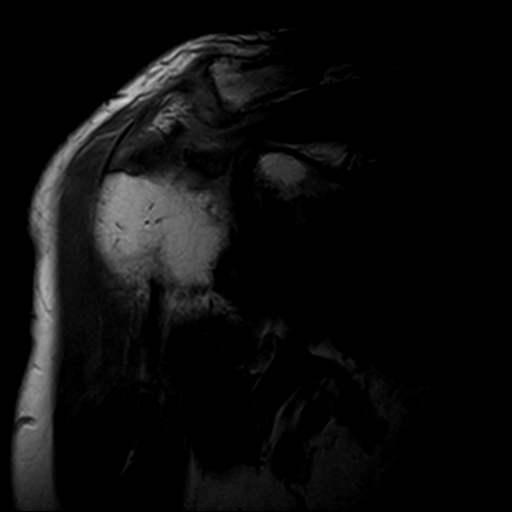
[im 9/17]
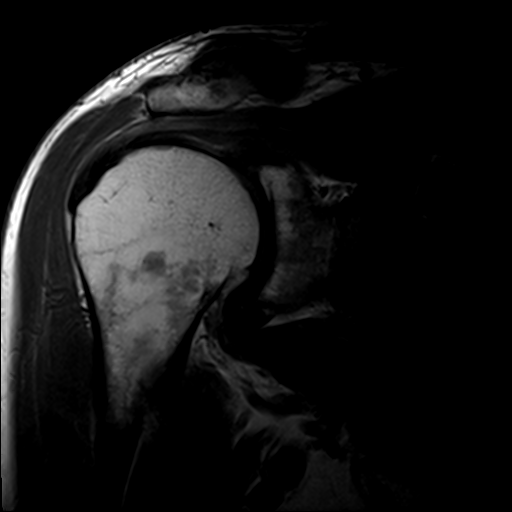
[im 11/17]
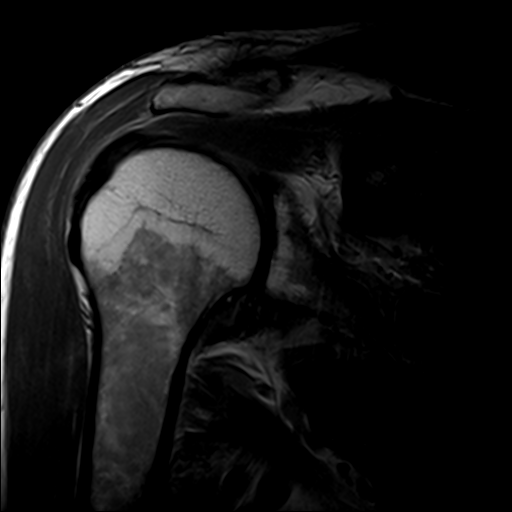
[im 14/17]
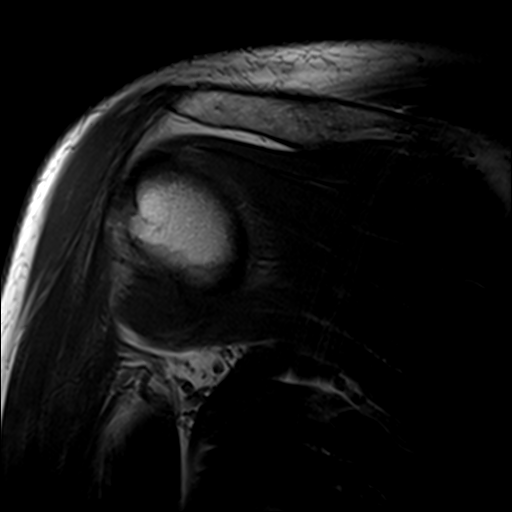
[im 17/17]
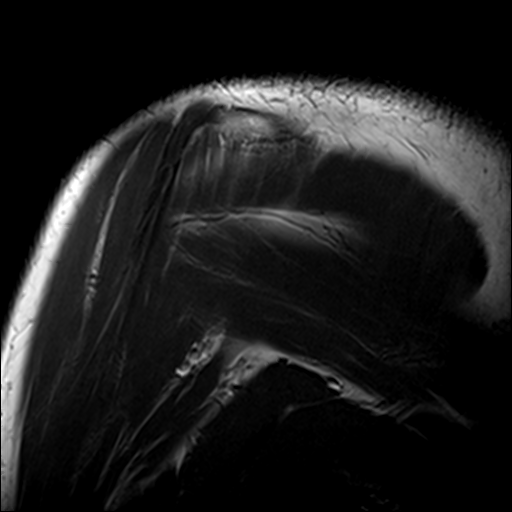

[29 of 40 positions shown; findings below may reference images not displayed]

FINDINGS: Rotator cuff: Severe tendinosis of the supraspinatus tendon with a
small partial articular surface tear and a small partial thickness
bursal surface tear of the anterior fibers. Mild tendinosis of the
infraspinatus tendon. Teres minor tendon is intact. Subscapularis
tendon is intact.

Muscles: No atrophy or fatty replacement of nor abnormal signal
within, the muscles of the rotator cuff.

Biceps long head: Mild tendinosis of the intraarticular portion of
the long head of the biceps tendon.

Acromioclavicular Joint: Moderate degenerative changes of the
acromioclavicular joint. Small amount of subacromial/ subdeltoid
bursal fluid. Type II acromion.

Glenohumeral Joint: No joint effusion. Mild partial thickness
cartilage loss.

Labrum: Grossly intact, but evaluation is limited by lack of
intraarticular fluid.

Bones: No focal marrow signal abnormality. No acute fracture or
dislocation.
IMPRESSION: 1. Severe tendinosis of the supraspinatus tendon with a small
partial articular surface tear and a small partial thickness bursal
surface tear of the anterior fibers.
2. Mild tendinosis of the infraspinatus tendon.
3. Mild tendinosis of the intraarticular portion of the long head of
the biceps tendon.
4. Mild subacromial/subdeltoid bursitis.

## 2018-01-26 ENCOUNTER — Telehealth (INDEPENDENT_AMBULATORY_CARE_PROVIDER_SITE_OTHER): Payer: Self-pay | Admitting: Orthopaedic Surgery

## 2018-01-26 NOTE — Telephone Encounter (Signed)
Please advise 

## 2018-01-26 NOTE — Telephone Encounter (Signed)
Please call patient to schedule appt  Thank you!

## 2018-01-26 NOTE — Telephone Encounter (Signed)
ok 

## 2018-01-26 NOTE — Telephone Encounter (Signed)
Patient request a refill on Celebrex 200mg  [facility] [address]

## 2018-01-26 NOTE — Telephone Encounter (Signed)
Has not been seen recently.  Requesting a refill.  Do you want to see him??

## 2018-01-26 NOTE — Telephone Encounter (Signed)
LMOM (home & cell) for patient to call and schedule an appointment.

## 2018-01-31 ENCOUNTER — Encounter (INDEPENDENT_AMBULATORY_CARE_PROVIDER_SITE_OTHER): Payer: Self-pay | Admitting: Orthopaedic Surgery

## 2018-01-31 ENCOUNTER — Ambulatory Visit (INDEPENDENT_AMBULATORY_CARE_PROVIDER_SITE_OTHER): Payer: Federal, State, Local not specified - PPO | Admitting: Orthopaedic Surgery

## 2018-01-31 DIAGNOSIS — M25572 Pain in left ankle and joints of left foot: Secondary | ICD-10-CM

## 2018-01-31 DIAGNOSIS — G8929 Other chronic pain: Secondary | ICD-10-CM

## 2018-01-31 DIAGNOSIS — M25512 Pain in left shoulder: Secondary | ICD-10-CM | POA: Diagnosis not present

## 2018-01-31 DIAGNOSIS — M25511 Pain in right shoulder: Secondary | ICD-10-CM | POA: Diagnosis not present

## 2018-01-31 MED ORDER — CELECOXIB 200 MG PO CAPS: 200.0000 mg | ORAL_CAPSULE | Freq: Every day | ORAL | 1 refills | Status: DC

## 2018-01-31 NOTE — Progress Notes (Signed)
Office Visit Note   Patient: Maxwell Leonard           Date of Birth: August 14, 1945           MRN: [id number] Visit Date: 01/31/2018              Requested by: Eulas Post, MD [place], [place] [address] PCP: Eulas Post, MD   Assessment & Plan: Visit Diagnoses:  1. Pain in left ankle and joints of left foot   2. Chronic pain of both shoulders     Plan: Multiple joint arthralgias.  Maxwell Leonard has taken Celebrex in the past with excellent relief of his pain.  Needs a refill.  I would just cautioned him to take it just as needed rather than every day if he has no pain  Follow-Up Instructions: Return if symptoms worsen or fail to improve.   Orders:  No orders of the defined types were placed in this encounter.  No orders of the defined types were placed in this encounter.     Procedures: No procedures performed   Clinical Data: No additional findings.   Subjective: No chief complaint on file. Maxwell Leonard is 72 years old and visited the office for follow-up evaluation of his joint complaints.  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  He has taken Celebrex with excellent relief.  He notes that there may be days that he has no pain and he tries to avoid the medicine  HPI  Review of Systems   Objective: Vital Signs: There were no vitals taken for this visit.  Physical Exam  Constitutional: He is oriented to person, place, and time. He appears well-developed and well-nourished.  HENT:  Mouth/Throat: Oropharynx is clear and moist.  Eyes: Pupils are equal, round, and reactive to light. EOM are normal.  Pulmonary/Chest: Effort normal.  Neurological: He is alert and oriented to person, place, and time.  Skin: Skin is warm and dry.  Psychiatric: He has a normal mood and affect. His behavior is normal.    Ortho Exam awake alert and oriented x3 comfortable sitting.  No  complaints of joint pain today.  Able to easily place both arms overhead without shoulder discomfort.  No pain with range of motion of the cervical spine.  Walks without a limp.  No specific knee pain.  Straight leg raise negative.  No pain with motion of his hips. Specialty Comments:  No specialty comments available.  Imaging: No results found.   PMFS History: Patient Active Problem List   Diagnosis Date Noted  . Osteoarthritis 02/02/2011  . Nicotine use disorder 02/02/2011  . Erectile dysfunction 02/02/2011   Past Medical History:  Diagnosis Date  . Arthritis   . Kidney stones 2011   had kidney stones spring of the year and then previously 52 yrs     Family History  Problem Relation Age of Onset  . Healthy Daughter   . Healthy Son   . Alzheimer's disease Mother   . Cancer Father 66       lung cancer    Past Surgical History:  Procedure Laterality Date  . TONSILLECTOMY     Social History   Occupational History  . Not on file  Tobacco Use  . Smoking status: Current Every Day Smoker    Packs/day: 0.50    Types: Cigarettes  . Smokeless tobacco: Never Used  Substance and Sexual Activity  .  Alcohol use: Yes  . Drug use: No  . Sexual activity: Not on file     Garald Balding, MD   Note - This record has been created using [employer].  Chart creation errors have been sought, but may not always  have been located. Such creation errors do not reflect on  the standard of medical care.

## 2018-05-04 ENCOUNTER — Other Ambulatory Visit: Payer: Self-pay

## 2018-05-04 ENCOUNTER — Ambulatory Visit: Payer: Federal, State, Local not specified - PPO | Admitting: Family Medicine

## 2018-05-04 ENCOUNTER — Encounter: Payer: Self-pay | Admitting: Family Medicine

## 2018-05-04 VITALS — BP 124/70 | HR 82 | Temp 98.2°F | Ht 64.0 in | Wt 166.8 lb

## 2018-05-04 DIAGNOSIS — L821 Other seborrheic keratosis: Secondary | ICD-10-CM | POA: Diagnosis not present

## 2018-05-04 DIAGNOSIS — D229 Melanocytic nevi, unspecified: Secondary | ICD-10-CM

## 2018-05-04 DIAGNOSIS — J441 Chronic obstructive pulmonary disease with (acute) exacerbation: Secondary | ICD-10-CM | POA: Diagnosis not present

## 2018-05-04 MED ORDER — DOXYCYCLINE HYCLATE 100 MG PO CAPS: 100.0000 mg | ORAL_CAPSULE | Freq: Two times a day (BID) | ORAL | 0 refills | Status: DC

## 2018-05-04 MED ORDER — PREDNISONE 20 MG PO TABS
ORAL_TABLET | ORAL | 0 refills | Status: DC
Start: 2018-05-04 — End: 2019-03-14

## 2018-05-04 NOTE — Patient Instructions (Signed)
Seborrheic Keratosis A seborrheic keratosis is a common, noncancerous (benign) skin growth. These growths are velvety, waxy, rough, tan, brown, or black spots that appear on the skin. These skin growths can be flat or raised, and scaly. What are the causes? The cause of this condition is not known. What increases the risk? You are more likely to develop this condition if you:  Have a family history of seborrheic keratosis.  Are 50 or older.  Are pregnant.  Have had estrogen replacement therapy. What are the signs or symptoms? Symptoms of this condition include growths on the face, chest, shoulders, back, or other areas. These growths:  Are usually painless, but may become irritated and itchy.  Can be yellow, brown, black, or other colors.  Are slightly raised or have a flat surface.  Are sometimes rough or wart-like in texture.  Are often velvety or waxy on the surface.  Are round or oval-shaped.  Often occur in groups, but may occur as a single growth. How is this diagnosed? This condition is diagnosed with a medical history and physical exam.  A sample of the growth may be tested (skin biopsy).  You may need to see a skin specialist (dermatologist). How is this treated? Treatment is not usually needed for this condition, unless the growths are irritated or bleed often.  You may also choose to have the growths removed if you do not like their appearance. ? Most commonly, these growths are treated with a procedure in which liquid nitrogen is applied to "freeze" off the growth (cryosurgery). ? They may also be burned off with electricity (electrocautery) or removed by scraping (curettage). Follow these instructions at home:  Watch your growth for any changes.  Keep all follow-up visits as told by your health care provider. This is important.  Do not scratch or pick at the growth or growths. This can cause them to become irritated or infected. Contact a health care  provider if:  You suddenly have many new growths.  Your growth bleeds, itches, or hurts.  Your growth suddenly becomes larger or changes color. Summary  A seborrheic keratosis is a common, noncancerous (benign) skin growth.  Treatment is not usually needed for this condition, unless the growths are irritated or bleed often.  Watch your growth for any changes.  Contact a health care provider if you suddenly have many new growths or your growth suddenly becomes larger or changes color.  Keep all follow-up visits as told by your health care provider. This is important. This information is not intended to replace advice given to you by your health care provider. Make sure you discuss any questions you have with your health care provider. Document Released: 03/28/2010 Document Revised: 07/08/2017 Document Reviewed: 07/08/2017 Elsevier Interactive Patient Education  2019 [employer].  Set up follow up for mole excision.

## 2018-05-04 NOTE — Progress Notes (Signed)
  Subjective:     Patient ID: Maxwell Leonard, male   DOB: 03-30-1945, 73 y.o.   MRN: [id number]  HPI Patient seen for the following issues  Onset of respiratory symptoms last week.  He was around someone who was sick on Friday and over the weekend he developed some sore throat for just 1 day followed by cough.  Is had some production of thick gray mucus.  No formal diagnosis of COPD via chest x-ray although back to 2012 which showed probable COPD changes.  Long history of smoking.  No fevers or chills.  No dyspnea at rest.  No chest pains.  No nausea or vomiting.  Patient has scaly lesion left lower back which he wishes to have evaluated.  Asymptomatic.  No bleeding or itching.  Past Medical History:  Diagnosis Date  . Arthritis   . Kidney stones 2011   had kidney stones spring of the year and then previously 20 yrs    Past Surgical History:  Procedure Laterality Date  . TONSILLECTOMY      reports that he has been smoking cigarettes. He has been smoking about 0.50 packs per day. He has never used smokeless tobacco. He reports current alcohol use. He reports that he does not use drugs. family history includes Alzheimer's disease in his mother; Cancer (age of onset: 100) in his father; Healthy in his daughter and son. No Known Allergies   Review of Systems  Constitutional: Positive for fatigue. Negative for chills and fever.  HENT: Positive for congestion. Negative for sinus pain.   Respiratory: Positive for cough and wheezing.   Cardiovascular: Negative for chest pain.       Objective:   Physical Exam Constitutional:      Appearance: Normal appearance.  HENT:     Right Ear: Tympanic membrane normal.     Left Ear: Tympanic membrane normal.     Mouth/Throat:     Pharynx: Oropharynx is clear. No oropharyngeal exudate.  Cardiovascular:     Rate and Rhythm: Normal rate and regular rhythm.  Pulmonary:     Comments: No retractions.  Normal respiratory rate.  Pulse oximetry 94%.  He  does have some diffuse expiratory wheezes.  No rales. Musculoskeletal:     Right lower leg: No edema.     Left lower leg: No edema.  Skin:    Comments: Has somewhat oval-shaped scaly brownish skin lesion left lower lumbar region.  Just medial to this he has about a 4 mm dark brown somewhat atypical nevus.  There is some issues with indistinct border and some asymmetry  Neurological:     Mental Status: He is alert.        Assessment:     #1 probable acute exacerbation of COPD.  No respiratory distress.  Question viral trigger.  He does have productive cough with increased volume of mucus and some early color changes  #2 benign-appearing seborrheic keratosis left lower back  #3 somewhat atypical nevus mid lower back region     Plan:    - -Start prednisone 20 mg 2 tablets daily for 5 days -Start doxycycline 100 mg twice daily for 10 days -Follow-up immediately for any dyspnea, fever, or other concerns -Schedule follow-up for mole shave excision mid lower back.  He would like to get over respiratory infection first and he will schedule this for 2 to 3 weeks from now  Maxwell Post MD [facility]

## 2018-05-25 ENCOUNTER — Ambulatory Visit: Payer: Federal, State, Local not specified - PPO | Admitting: Family Medicine

## 2018-07-06 ENCOUNTER — Telehealth: Payer: Self-pay | Admitting: Orthopaedic Surgery

## 2018-07-06 NOTE — Telephone Encounter (Signed)
Patient called requesting prescription refill of Diclofenac Sodium 1% Gel (4 tubes) to be sent to [facility] [address] in [employer].

## 2018-07-07 ENCOUNTER — Other Ambulatory Visit (INDEPENDENT_AMBULATORY_CARE_PROVIDER_SITE_OTHER): Payer: Self-pay | Admitting: Orthopedic Surgery

## 2018-07-07 MED ORDER — DICLOFENAC SODIUM 1 % TD GEL: 2.0000 g | Freq: Four times a day (QID) | TRANSDERMAL | 1 refills | Status: DC | PRN

## 2018-07-07 NOTE — Telephone Encounter (Signed)
Please advise 

## 2018-07-07 NOTE — Telephone Encounter (Signed)
Sent in

## 2018-07-07 NOTE — Telephone Encounter (Signed)
I called patient 

## 2018-07-24 ENCOUNTER — Other Ambulatory Visit (INDEPENDENT_AMBULATORY_CARE_PROVIDER_SITE_OTHER): Payer: Self-pay | Admitting: Orthopaedic Surgery

## 2018-07-25 NOTE — Telephone Encounter (Signed)
LMOM for patient

## 2018-07-25 NOTE — Telephone Encounter (Signed)
Please advise 

## 2018-12-27 ENCOUNTER — Telehealth: Payer: Self-pay | Admitting: Orthopaedic Surgery

## 2018-12-27 NOTE — Telephone Encounter (Signed)
Please advise 

## 2018-12-27 NOTE — Telephone Encounter (Signed)
Patient called requesting prescription refill of Celecoxib to be [facility] to [facility] at [address]

## 2018-12-28 ENCOUNTER — Other Ambulatory Visit: Payer: Self-pay | Admitting: Orthopaedic Surgery

## 2018-12-28 MED ORDER — CELECOXIB 200 MG PO CAPS: ORAL_CAPSULE | ORAL | 2 refills | Status: DC

## 2018-12-28 NOTE — Telephone Encounter (Signed)
Done

## 2018-12-28 NOTE — Telephone Encounter (Signed)
Ok to renew?  

## 2019-03-14 ENCOUNTER — Ambulatory Visit (INDEPENDENT_AMBULATORY_CARE_PROVIDER_SITE_OTHER): Payer: Federal, State, Local not specified - PPO

## 2019-03-14 ENCOUNTER — Other Ambulatory Visit: Payer: Self-pay

## 2019-03-14 ENCOUNTER — Encounter: Payer: Self-pay | Admitting: Family Medicine

## 2019-03-14 ENCOUNTER — Ambulatory Visit: Payer: Federal, State, Local not specified - PPO | Admitting: Family Medicine

## 2019-03-14 ENCOUNTER — Telehealth: Payer: Self-pay | Admitting: *Deleted

## 2019-03-14 VITALS — BP 138/80 | HR 82 | Temp 96.6°F | Resp 16 | Ht 64.0 in | Wt 164.0 lb

## 2019-03-14 DIAGNOSIS — K56609 Unspecified intestinal obstruction, unspecified as to partial versus complete obstruction: Secondary | ICD-10-CM | POA: Diagnosis not present

## 2019-03-14 DIAGNOSIS — R030 Elevated blood-pressure reading, without diagnosis of hypertension: Secondary | ICD-10-CM | POA: Diagnosis not present

## 2019-03-14 DIAGNOSIS — R1031 Right lower quadrant pain: Secondary | ICD-10-CM | POA: Diagnosis not present

## 2019-03-14 DIAGNOSIS — K5900 Constipation, unspecified: Secondary | ICD-10-CM | POA: Diagnosis not present

## 2019-03-14 LAB — COMPREHENSIVE METABOLIC PANEL
ALT: 21 U/L (ref 0–53)
AST: 25 U/L (ref 0–37)
Albumin: 4.3 g/dL (ref 3.5–5.2)
Alkaline Phosphatase: 49 U/L (ref 39–117)
BUN: 22 mg/dL (ref 6–23)
CO2: 29 mEq/L (ref 19–32)
Calcium: 9.3 mg/dL (ref 8.4–10.5)
Chloride: 102 mEq/L (ref 96–112)
Creatinine, Ser: 1.5 mg/dL (ref 0.40–1.50)
GFR: 45.63 mL/min — ABNORMAL LOW (ref >60.00)
Glucose, Bld: 115 mg/dL — ABNORMAL HIGH (ref 70–99)
Potassium: 4.7 mEq/L (ref 3.5–5.1)
Sodium: 140 mEq/L (ref 135–145)
Total Bilirubin: 0.8 mg/dL (ref 0.2–1.2)
Total Protein: 6.3 g/dL (ref 6.0–8.3)

## 2019-03-14 LAB — URINALYSIS, ROUTINE W REFLEX MICROSCOPIC
Bilirubin Urine: NEGATIVE
Ketones, ur: 40 — AB
Leukocytes,Ua: NEGATIVE
Nitrite: NEGATIVE
Specific Gravity, Urine: 1.025 (ref 1.000–1.030)
Urine Glucose: NEGATIVE
Urobilinogen, UA: 0.2 — AB (ref 0.0–1.0)
pH: 5 (ref 5.0–8.0)

## 2019-03-14 LAB — CBC WITH DIFFERENTIAL/PLATELET
Basophils Absolute: 0.1 10*3/uL (ref 0.0–0.1)
Basophils Relative: 0.6 % (ref 0.0–3.0)
Eosinophils Absolute: 0.1 10*3/uL (ref 0.0–0.7)
Eosinophils Relative: 0.3 % (ref 0.0–5.0)
HCT: 39 % (ref 39.0–52.0)
Hemoglobin: 13.3 g/dL (ref 13.0–17.0)
Lymphocytes Relative: 7 % — ABNORMAL LOW (ref 12.0–46.0)
Lymphs Abs: 1.1 10*3/uL (ref 0.7–4.0)
MCHC: 33.9 g/dL (ref 30.0–36.0)
MCV: 91.2 fl (ref 78.0–100.0)
Monocytes Absolute: 1.4 10*3/uL — ABNORMAL HIGH (ref 0.1–1.0)
Monocytes Relative: 8.7 % (ref 3.0–12.0)
Neutro Abs: 13.3 10*3/uL — ABNORMAL HIGH (ref 1.4–7.7)
Neutrophils Relative %: 83.4 % — ABNORMAL HIGH (ref 43.0–77.0)
Platelets: 280 10*3/uL (ref 150.0–400.0)
RBC: 4.28 Mil/uL (ref 4.22–5.81)
RDW: 13.1 % (ref 11.5–15.5)
WBC: 16 10*3/uL — ABNORMAL HIGH (ref 4.0–10.5)

## 2019-03-14 LAB — TSH: TSH: 0.74 u[IU]/mL (ref 0.35–4.50)

## 2019-03-14 NOTE — Patient Instructions (Signed)
A few things to remember from today's visit:   Right lower quadrant abdominal pain - Plan: DG Abd 2 Views, Urinalysis, Routine w reflex microscopic, DG Abd 2 Views, Urinalysis, Routine w reflex microscopic, CBC with Differential/Platelet, CMP, TSH, CANCELED: Urinalysis, Routine w reflex microscopic  Elevated blood pressure reading - Plan: TSH  Constipation, unspecified constipation type - Plan: TSH  Intestinal obstruction, unspecified cause, unspecified whether partial or complete ([place]) - Plan: CT Abdomen Pelvis W Contrast  No food, just clear fluids small sips.  GET HELP RIGHT AWAY IF:   The pain is does not go away within 2 hours.  Sudden severe/worsening pain.  You keep throwing up (vomiting).  The pain changes and is only in the right or left part of the belly.  Not being able to pass gas or poop.  You have bloody or tarry looking poop.   MAKE SURE YOU:   Understand these instructions.  Will watch your condition.  Will get help right away if you are not doing well or get worse.   If symptoms are persistent please arrange a follow up appointment.    Please be sure medication list is accurate. If a new problem present, please set up appointment sooner than planned today.

## 2019-03-14 NOTE — Telephone Encounter (Signed)
Dr. Martinique spoke with pt & his wife.

## 2019-03-14 NOTE — Telephone Encounter (Signed)
Patient had an appointment with Dr. Martinique today, also xray today and CT scheduled tomorrow

## 2019-03-14 NOTE — Progress Notes (Signed)
ACUTE VISIT   HPI:  Chief Complaint  Patient presents with  . Abdominal Pain    started Thursday, ok on saturday, came back saturday night. has been using pepto bismol, gas x, laxative powder, suppositories and warm baths. Last full bowel movement was on Saturday with black stool.     Maxwell Leonard is a 74 y.o. male with history of COPD, tobacco use, and OA who is here today with above complaint. He is reporting problem as new and slowly getting worse.  RLQ "intense" abdominal pain,not radiated. He has not identified exacerbating factors. It is alleviated by getting in the bathtub with warm water. OTC treatment have not helped.  No Hx of abdominal surgeries.  New onset constipation, small and hard stool,last 3 days ago. He has not noted blood in stool. Black stool attributed to Pepto bismol.  He has passed gas x 3 since onset,last time yesterday. Increased burping,this is exacerbated by movement of lower extremities and talking.  Denies fever, chills, sore throat, dysphagia, N/V, or urinary symptoms.  BP elevated today. No history of hypertension. He is on chronic NSAIDs for OA. He has not noted unusual headache, visual changes, CP, dyspnea, or edema.  Lab Results  Component Value Date   CREATININE 1.09 05/07/2010   BUN 16 05/07/2010   NA 138 05/07/2010   K 4.1 05/07/2010   CL 104 05/07/2010   CO2 26 05/07/2010    Review of Systems  Constitutional: Positive for appetite change and fatigue. Negative for activity change, chills and fever.  Respiratory: Negative for cough and wheezing.   Cardiovascular: Negative for palpitations.  Gastrointestinal: Positive for abdominal distention. Negative for nausea and vomiting.  Endocrine: Negative for cold intolerance and heat intolerance.  Genitourinary: Negative for decreased urine volume, dysuria and hematuria.  Musculoskeletal: Positive for arthralgias. Negative for gait problem.  Neurological: Negative for  dizziness, syncope and weakness.  Rest see pertinent positives and negatives per HPI.   Current Outpatient Medications on File Prior to Visit  Medication Sig Dispense Refill  . albuterol (PROVENTIL HFA;VENTOLIN HFA) 108 (90 Base) MCG/ACT inhaler Inhale 2 puffs into the lungs every 6 (six) hours as needed for wheezing or shortness of breath. 1 Inhaler 2  . aspirin 81 MG tablet Take 81 mg by mouth daily. Reported on 07/02/2015    . celecoxib (CELEBREX) 200 MG capsule 1 tablet daily 30 capsule 2  . diclofenac sodium (VOLTAREN) 1 % GEL Apply 2-4 g topically 4 (four) times daily as needed. 5 Tube 1   No current facility-administered medications on file prior to visit.     Past Medical History:  Diagnosis Date  . Arthritis   . Kidney stones 2011   had kidney stones spring of the year and then previously 20 yrs    No Known Allergies  Social History   Socioeconomic History  . Marital status: Married    Spouse name: Not on file  . Number of children: Not on file  . Years of education: Not on file  . Highest education level: Not on file  Occupational History  . Not on file  Tobacco Use  . Smoking status: Current Every Day Smoker    Packs/day: 0.50    Types: Cigarettes  . Smokeless tobacco: Never Used  Substance and Sexual Activity  . Alcohol use: Yes  . Drug use: No  . Sexual activity: Not on file  Other Topics Concern  . Not on file  Social History Narrative  .  Not on file   Social Determinants of Health   Financial Resource Strain:   . Difficulty of Paying Living Expenses: Not on file  Food Insecurity:   . Worried About Charity fundraiser in the Last Year: Not on file  . Ran Out of Food in the Last Year: Not on file  Transportation Needs:   . Lack of Transportation (Medical): Not on file  . Lack of Transportation (Non-Medical): Not on file  Physical Activity:   . Days of Exercise per Week: Not on file  . Minutes of Exercise per Session: Not on file  Stress:   .  Feeling of Stress : Not on file  Social Connections:   . Frequency of Communication with Friends and Family: Not on file  . Frequency of Social Gatherings with Friends and Family: Not on file  . Attends Religious Services: Not on file  . Active Member of Clubs or Organizations: Not on file  . Attends Archivist Meetings: Not on file  . Marital Status: Not on file    Vitals:   03/14/19 0856 03/14/19 0952  BP: (!) 160/80 138/80  Pulse: 82   Resp: 16   Temp: (!) 96.6 F (35.9 C)   SpO2: 95%    Body mass index is 28.15 kg/m.  Physical Exam  Nursing note and vitals reviewed. Constitutional: He is oriented to person, place, and time. He appears well-developed. He appears distressed (at times when pain is present.).  HENT:  Head: Normocephalic and atraumatic.  Mouth/Throat: Oropharynx is clear and moist. Mucous membranes are dry.  Eyes: Conjunctivae are normal.  Cardiovascular: Normal rate and regular rhythm.  No murmur heard. Respiratory: Effort normal and breath sounds normal. No respiratory distress.  GI: Soft. He exhibits distension. He exhibits no abdominal bruit, no ascites and no mass. Bowel sounds are increased. There is no hepatomegaly. There is abdominal tenderness (RLQ >RUQ) in the right upper quadrant and right lower quadrant. There is no rigidity, no rebound, no guarding, no CVA tenderness, no tenderness at McBurney's point and negative Murphy's sign.  RLQ>RUQ   Musculoskeletal:        General: No edema.  Lymphadenopathy:    He has no cervical adenopathy.  Neurological: He is alert and oriented to person, place, and time. He has normal strength. No cranial nerve deficit.  Stable gait, not assisted.  Skin: Skin is warm. No erythema.  Psychiatric: He has a normal mood and affect.  Well groomed, good eye contact.   ASSESSMENT AND PLAN:  Maxwell Leonard was seen today for abdominal pain.  Diagnoses and all orders for this visit:  Orders Placed This Encounter    Procedures  . DG Abd 2 Views  . CT Abdomen Pelvis W Contrast  . Urinalysis, Routine w reflex microscopic  . CBC with Differential/Platelet  . CMP  . TSH   Lab Results  Component Value Date   TSH 0.74 03/14/2019   Lab Results  Component Value Date   CREATININE 1.50 03/14/2019   BUN 22 03/14/2019   NA 140 03/14/2019   K 4.7 03/14/2019   CL 102 03/14/2019   CO2 29 03/14/2019   Lab Results  Component Value Date   ALT 21 03/14/2019   AST 25 03/14/2019   ALKPHOS 49 03/14/2019   BILITOT 0.8 03/14/2019   Lab Results  Component Value Date   WBC 16.0 (H) 03/14/2019   HGB 13.3 03/14/2019   HCT 39.0 03/14/2019   MCV 91.2 03/14/2019  PLT 280.0 03/14/2019    Right lower quadrant abdominal pain We discussed possible etiologies. Intestinal obstruction needs to be considered. I do not think process is infectious.  X ray today  air fluid level x 1-2, gas in small intestine mainly,no free air. Pending formal report. He does not want to go to the ER now, so blood work done today and abdominal CT scheduled for tomorrow. He understands when to seek immediate medical attention.  Elevated blood pressure reading BP normalized when BP rechecked while he was pain free.  -     TSH  Constipation, unspecified constipation type Adequate hydration. I do not recommend trying laxatives,side effects and possible complications discussed.  Instructed about warning signs.  -     DG Abd 2 Views; Future -     TSH  Intestinal obstruction, unspecified cause, unspecified whether partial or complete ([place]) I am concerned about the possibility of bowel obstruction. No signs of acute abdomen at this time, he does not want to go to the ER now.   We discussed signs and symptoms, prognosis/complications,and treatment options. Stop solids intake. Small and frequent sips of clear fluids. Monitor for N/V,worsening pain,blood in stool,na fever among some. We could not arrange CT for today but able to  arrange for tomorrow am. Clearly instructed about warning signs.  -     CT Abdomen Pelvis W Contrast; Future   Return in about 1 week (around 03/21/2019).   Ahmon Tosi G. Martinique, MD  [facility]. [place] office.

## 2019-03-14 NOTE — Telephone Encounter (Signed)
Copied from [place] [phone]. Topic: General - Other >> Mar 14, 2019  1:00 PM Leward Quan A wrote: Reason for CRM: Patient wife called to say that the pain he is in is unbearable and he would like something to help with the pain because he want to avoid going to the ED. Please advise Ph# [phone] or [phone]

## 2019-03-15 ENCOUNTER — Other Ambulatory Visit: Payer: Self-pay

## 2019-03-15 ENCOUNTER — Emergency Department (HOSPITAL_COMMUNITY)
Admission: EM | Admit: 2019-03-15 | Discharge: 2019-03-15 | Disposition: A | Discharge status: Home / Self Care | Payer: Federal, State, Local not specified - PPO | Attending: Emergency Medicine | Admitting: Emergency Medicine

## 2019-03-15 ENCOUNTER — Other Ambulatory Visit: Payer: Self-pay | Admitting: Family Medicine

## 2019-03-15 ENCOUNTER — Ambulatory Visit (INDEPENDENT_AMBULATORY_CARE_PROVIDER_SITE_OTHER)
Admission: RE | Admit: 2019-03-15 | Discharge: 2019-03-15 | Disposition: A | Discharge status: Home / Self Care | Payer: Federal, State, Local not specified - PPO | Source: Ambulatory Visit | Attending: Family Medicine | Admitting: Family Medicine

## 2019-03-15 DIAGNOSIS — N201 Calculus of ureter: Secondary | ICD-10-CM | POA: Diagnosis not present

## 2019-03-15 DIAGNOSIS — R1031 Right lower quadrant pain: Secondary | ICD-10-CM | POA: Diagnosis present

## 2019-03-15 DIAGNOSIS — N1330 Unspecified hydronephrosis: Secondary | ICD-10-CM

## 2019-03-15 DIAGNOSIS — F17210 Nicotine dependence, cigarettes, uncomplicated: Secondary | ICD-10-CM | POA: Diagnosis not present

## 2019-03-15 DIAGNOSIS — N200 Calculus of kidney: Secondary | ICD-10-CM

## 2019-03-15 DIAGNOSIS — Z79899 Other long term (current) drug therapy: Secondary | ICD-10-CM | POA: Diagnosis not present

## 2019-03-15 DIAGNOSIS — K56609 Unspecified intestinal obstruction, unspecified as to partial versus complete obstruction: Secondary | ICD-10-CM | POA: Diagnosis not present

## 2019-03-15 LAB — CBC
HCT: 39.7 % (ref 39.0–52.0)
Hemoglobin: 13.5 g/dL (ref 13.0–17.0)
MCH: 31.7 pg (ref 26.0–34.0)
MCHC: 34 g/dL (ref 30.0–36.0)
MCV: 93.2 fL (ref 80.0–100.0)
Platelets: 275 10*3/uL (ref 150–400)
RBC: 4.26 MIL/uL (ref 4.22–5.81)
RDW: 12.8 % (ref 11.5–15.5)
WBC: 16 10*3/uL — ABNORMAL HIGH (ref 4.0–10.5)
nRBC: 0 % (ref 0.0–0.2)

## 2019-03-15 LAB — URINALYSIS, ROUTINE W REFLEX MICROSCOPIC
Bacteria, UA: NONE SEEN
Bilirubin Urine: NEGATIVE
Glucose, UA: NEGATIVE mg/dL
Ketones, ur: 20 mg/dL — AB
Leukocytes,Ua: NEGATIVE
Nitrite: NEGATIVE
Protein, ur: NEGATIVE mg/dL
Specific Gravity, Urine: 1.043 — ABNORMAL HIGH (ref 1.005–1.030)
pH: 5 (ref 5.0–8.0)

## 2019-03-15 LAB — BASIC METABOLIC PANEL
Anion gap: 10 (ref 5–15)
BUN: 21 mg/dL (ref 8–23)
CO2: 28 mmol/L (ref 22–32)
Calcium: 9.1 mg/dL (ref 8.9–10.3)
Chloride: 99 mmol/L (ref 98–111)
Creatinine, Ser: 1.79 mg/dL — ABNORMAL HIGH (ref 0.61–1.24)
GFR calc Af Amer: 42 mL/min — ABNORMAL LOW (ref >60)
GFR calc non Af Amer: 37 mL/min — ABNORMAL LOW (ref >60)
Glucose, Bld: 97 mg/dL (ref 70–99)
Potassium: 4.5 mmol/L (ref 3.5–5.1)
Sodium: 137 mmol/L (ref 135–145)

## 2019-03-15 MED ORDER — IOHEXOL 300 MG/ML  SOLN
100.0000 mL | Freq: Once | INTRAMUSCULAR | Status: AC | PRN
  Administered 2019-03-15: 100 mL via INTRAVENOUS

## 2019-03-15 MED ORDER — HYDROCODONE-ACETAMINOPHEN 5-325 MG PO TABS
1.0000 | ORAL_TABLET | Freq: Once | ORAL | Status: AC
  Administered 2019-03-15: 1 via ORAL
  Filled 2019-03-15: qty 1

## 2019-03-15 MED ORDER — SODIUM CHLORIDE 0.9 % IV BOLUS
500.0000 mL | Freq: Once | INTRAVENOUS | Status: AC
  Administered 2019-03-15: 18:00:00 500 mL via INTRAVENOUS

## 2019-03-15 NOTE — Telephone Encounter (Signed)
Dr. Martinique spoke with pt's wife. Urgent referral to urology entered, waiting on appt.

## 2019-03-15 NOTE — Telephone Encounter (Signed)
[facility] called to make [place] aware that pt's wife declined the appointment and is at [facility] long now with pt.

## 2019-03-15 NOTE — ED Triage Notes (Signed)
Pt arrived POV ambulatory into ED with CC abd pain r/t 29mm kidney stone as seen on CT this morning by [facility] which referred pt to the ED. Pt also reports possible dehydration w/ lack of urination. VSS afebrile.

## 2019-03-15 NOTE — Discharge Instructions (Addendum)
Your CT scan showed a kidney stone on the right side and multiple cysts on your kidneys.  Please follow up with your doctor about these results.   If your pain becomes severe, you get fevers or have other concerns please return to the [facility] long ED.   Please make sure you are continuing to drink extra water to continue hydrating your self.   Today you received medications that may make you sleepy or impair your ability to make decisions.  For the next 24 hours please do not drive, operate heavy machinery, care for a small child with out another adult present, or perform any activities that may cause harm to you or someone else if you were to fall asleep or be impaired.

## 2019-03-15 NOTE — ED Notes (Signed)
Pt made aware urine sample needed 

## 2019-03-15 NOTE — ED Notes (Signed)
Pt verbalizes understanding of DC instructions. Pt belongings returned and is ambulatory out of ED.  

## 2019-03-15 NOTE — ED Notes (Signed)
Signature pad not working Network engineer made aware

## 2019-03-15 NOTE — ED Provider Notes (Addendum)
[place] DEPT Provider Note   CSN: [id number] Arrival date & time: 03/15/19  1534     History No chief complaint on file.   Maxwell Leonard is a 74 y.o. male with a past medical history of occasional kidney stones, arthritis, who presents today for evaluation of a kidney stone.  He was originally seen by his PCP yesterday for abdominal pain with concern of an obstruction as he reports he has been constipated recently.  He reports that a CAT scan today, and was in contact with [facility], where he was seen 9 years ago, and was referred to the emergency room.  He reports that currently his pain is about a 6 out of 10.  That it will get severe and wax and wane throughout the day.  He states that since yesterday the pain feels like it has moved more from his right side into his right anterior lower quadrant.  He denies any nausea or vomiting.  He reports his pain is made better with warm baths.  He states that he has had decreased urinary output.  He states that he was told he had severe dehydration.    Labs show that yesterday he had large hemoglobin in his urine, 40 ketones, nitrite negative.  White count was elevated at 16.   HPI     Past Medical History:  Diagnosis Date  . Arthritis   . Kidney stones 2011   had kidney stones spring of the year and then previously 20 yrs     Patient Active Problem List   Diagnosis Date Noted  . Osteoarthritis 02/02/2011  . Nicotine use disorder 02/02/2011  . Erectile dysfunction 02/02/2011    Past Surgical History:  Procedure Laterality Date  . TONSILLECTOMY         Family History  Problem Relation Age of Onset  . Healthy Daughter   . Healthy Son   . Alzheimer's disease Mother   . Cancer Father 48       lung cancer    Social History   Tobacco Use  . Smoking status: Current Every Day Smoker    Packs/day: 0.50    Types: Cigarettes  . Smokeless tobacco: Never Used  Substance Use Topics  .  Alcohol use: Yes  . Drug use: No    Home Medications Prior to Admission medications   Medication Sig Start Date End Date Taking? Authorizing Provider  albuterol (PROVENTIL HFA;VENTOLIN HFA) 108 (90 Base) MCG/ACT inhaler Inhale 2 puffs into the lungs every 6 (six) hours as needed for wheezing or shortness of breath. 01/20/16   Burchette, Alinda Sierras, MD  aspirin 81 MG tablet Take 81 mg by mouth daily. Reported on 07/02/2015    [provider]  celecoxib (CELEBREX) 200 MG capsule 1 tablet daily 12/28/18   Garald Balding, MD  diclofenac sodium (VOLTAREN) 1 % GEL Apply 2-4 g topically 4 (four) times daily as needed. 07/07/18   Cherylann Ratel, PA-C    Allergies    Patient has no known allergies.  Review of Systems   Review of Systems  Constitutional: Negative for chills and fever.  Respiratory: Negative for cough, choking and shortness of breath.   Gastrointestinal: Positive for abdominal pain and constipation. Negative for diarrhea, nausea and rectal pain.  Genitourinary: Positive for decreased urine volume, flank pain, frequency and hematuria. Negative for difficulty urinating, dysuria, penile pain and urgency.  Musculoskeletal: Negative for back pain and neck pain.  Skin: Negative for color  change and wound.  Neurological: Negative for headaches.  Psychiatric/Behavioral: Negative for confusion.  All other systems reviewed and are negative.   Physical Exam Updated Vital Signs BP (!) 145/85   Pulse 70   Temp 98.3 F (36.8 C)   Resp 18   SpO2 99%   Physical Exam Vitals and nursing note reviewed.  Constitutional:      Appearance: He is well-developed. He is not ill-appearing.  HENT:     Head: Normocephalic and atraumatic.     Mouth/Throat:     Mouth: Mucous membranes are moist.  Eyes:     Conjunctiva/sclera: Conjunctivae normal.  Cardiovascular:     Rate and Rhythm: Normal rate and regular rhythm.     Heart sounds: No murmur.  Pulmonary:     Effort: Pulmonary  effort is normal. No respiratory distress.     Breath sounds: Normal breath sounds.  Abdominal:     General: Abdomen is flat. Bowel sounds are normal. There is no distension.     Palpations: Abdomen is soft.     Tenderness: There is no abdominal tenderness. There is right CVA tenderness. There is no left CVA tenderness or guarding.  Musculoskeletal:     Cervical back: Neck supple.  Skin:    General: Skin is warm and dry.  Neurological:     General: No focal deficit present.     Mental Status: He is alert and oriented to person, place, and time.  Psychiatric:        Mood and Affect: Mood normal.        Behavior: Behavior normal.        Thought Content: Thought content normal.     ED Results / Procedures / Treatments   Labs (all labs ordered are listed, but only abnormal results are displayed) Labs Reviewed  URINALYSIS, ROUTINE W REFLEX MICROSCOPIC - Abnormal; Notable for the following components:      Result Value   Specific Gravity, Urine 1.043 (*)    Hgb urine dipstick SMALL (*)    Ketones, ur 20 (*)    All other components within normal limits  BASIC METABOLIC PANEL - Abnormal; Notable for the following components:   Creatinine, Ser 1.79 (*)    GFR calc non Af Amer 37 (*)    GFR calc Af Amer 42 (*)    All other components within normal limits  CBC - Abnormal; Notable for the following components:   WBC 16.0 (*)    All other components within normal limits    EKG  None  Radiology- obtained PTA CT Abdomen Pelvis W Contrast  Result Date: 03/15/2019 CLINICAL DATA:  Severe abdominal pain, decreased flatus and increased burping. Possible bowel obstruction. Constipation. EXAM: CT ABDOMEN AND PELVIS WITH CONTRAST TECHNIQUE: Multidetector CT imaging of the abdomen and pelvis was performed using the standard protocol following bolus administration of intravenous contrast. CONTRAST:  164mL OMNIPAQUE IOHEXOL 300 MG/ML  SOLN COMPARISON:  05/08/2010. FINDINGS: Lower chest: Lung bases  are clear. Heart size normal. Atherosclerotic calcification of the aorta and coronary arteries. No pericardial pleural effusion. Hepatobiliary: There are several small low-attenuation lesions in the liver, measuring up to 1.3 cm in segment 4 of the liver, likely representing cysts. Liver and gallbladder are otherwise unremarkable. No biliary ductal dilatation. Pancreas: Negative. Spleen: Negative. Adrenals/Urinary Tract: Image quality is degraded by respiratory motion. Adrenal glands are unremarkable. There are multiple large low-attenuation lesions arising from the kidneys bilaterally, measuring up to 10.6 cm off the lower pole right  kidney. This includes a large exophytic cyst off the lower pole right kidney, measuring 8.3 cm, enlarged from 6.2 cm on 05/07/2010. Bilateral renal stones. Severe right hydronephrosis secondary to a 4 mm stone at the right ureterovesical junction (2/61). Left ureter is decompressed. Bladder is low in volume and slightly thick-walled. Stomach/Bowel: Stomach is decompressed. Small bowel, appendix and colon are unremarkable. Vascular/Lymphatic: Atherosclerotic calcification of the aorta without aneurysm. No pathologically enlarged lymph nodes. Reproductive: Prostate is rather enlarged and indents the bladder. Other: No free fluid.  Mesenteries and peritoneum are unremarkable. Musculoskeletal: Degenerative changes in the spine. 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 Slight retrolisthesis of L4 on L5 as a result. IMPRESSION: 1. Severe right hydronephrosis secondary to a 4 mm stone at the right ureterovesical junction. 2. No evidence of bowel obstruction. 3. Bilateral renal stones. 4. Bladder wall thickening is indicative of outlet obstruction, related to a rather enlarged prostate. 5. Multiple bilateral renal cysts. 6. 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 7.  Aortic atherosclerosis (ICD10-I70.0). Coronary artery calcification. Electronically Signed   By: Lorin Picket M.D.   On: 03/15/2019 10:48   DG Abd 2 Views  Result Date: 03/14/2019 CLINICAL DATA:  Right-sided abdominal pain. EXAM: ABDOMEN - 2 VIEW COMPARISON:  None. FINDINGS: Gas in nondilated large and small bowel loops. Air-fluid level right mid abdomen probably within the cecum. No free air. No urinary tract calculi. Advanced degenerative change lumbar spine without acute skeletal abnormality. IMPRESSION: Gas in nondilated large and small bowel loops most consistent with ileus or gastroenteritis. Electronically Signed   By: Franchot Gallo M.D.   On: 03/14/2019 11:01    Procedures Procedures (including critical care time)  Medications Ordered in ED Medications  HYDROcodone-acetaminophen (NORCO/VICODIN) 5-325 MG per tablet 1 tablet (1 tablet Oral Given 03/15/19 1725)  sodium chloride 0.9 % bolus 500 mL (0 mLs Intravenous Stopped 03/15/19 1826)    ED Course  I have reviewed the triage vital signs and the nursing notes.  Pertinent labs & imaging results that were available during my care of the patient were reviewed by me and considered in my medical decision making (see chart for details).    MDM Rules/Calculators/A&P                     Patient presents today for evaluation of a 4 mm right-sided ureterovesical junction renal stone confirmed with CT scan prior to arrival.  His pain was treated in the emergency room with Norco.  He was given IV fluids.  His white count is elevated at 16, which I suspect is reactive given his pain.  UA without evidence of infection with 0-5 red cells, 0-5 white cells, no bacteria seen. Both UA and BMP were obtained prior to IV hydration showing a mild creatinine elevation at 1.79 and mild dehydration at 1.043.  Patient was given IV rehydration.  He states that he believes he passed the stone while he was here and wished to go home.  I offered him a prescription  for Norco and antiemetics at home which he refused, stating he will follow up with urology tomorrow.  Recommended he continue p.o. hydration at home.  This patient was seen as a  Shared visit with Dr. Sherry Ruffing.   Return precautions were discussed with patient who states their understanding.  At the time of discharge patient denied any unaddressed complaints or concerns.  Patient is  agreeable for discharge home.  Note: Portions of this report may have been transcribed using voice recognition software. Every effort was made to ensure accuracy; however, inadvertent computerized transcription errors may be present  Final Clinical Impression(s) / ED Diagnoses Final diagnoses:  Ureteral stone  Kidney stones    Rx / DC Orders ED Discharge Orders    None       Lorin Glass, PA-C 03/15/19 2123    Lorin Glass, PA-C 03/15/19 2124    Tegeler, Gwenyth Allegra, MD 03/16/19 0104

## 2019-03-15 NOTE — Telephone Encounter (Signed)
Pt's spouse called in, she said that pt is having a lot of pain, she would like to know if provider could request a STAT report for results?   Please assist.

## 2019-03-15 NOTE — ED Notes (Signed)
Pt ambulatory to RR independently  

## 2019-03-15 NOTE — Telephone Encounter (Signed)
He needs to go to the ER now. He needs urgent urology consultation and renal function needs to be re-evaluated. Thanks, BJ

## 2019-04-07 ENCOUNTER — Other Ambulatory Visit: Payer: Self-pay | Admitting: Urology

## 2019-04-10 ENCOUNTER — Ambulatory Visit: Payer: Federal, State, Local not specified - PPO | Attending: Internal Medicine

## 2019-04-10 ENCOUNTER — Ambulatory Visit: Payer: Federal, State, Local not specified - PPO

## 2019-04-10 DIAGNOSIS — Z23 Encounter for immunization: Secondary | ICD-10-CM | POA: Insufficient documentation

## 2019-04-10 NOTE — Progress Notes (Signed)
   Covid-19 Vaccination Clinic  Name:  Maxwell Leonard    MRN: [id number] DOB: 05-Apr-1945  04/10/2019  Mr. Say was observed post Covid-19 immunization for 15 minutes without incidence. He was provided with Vaccine Information Sheet and instruction to access the V-Safe system.   [name] was instructed to call 911 with any severe reactions post vaccine: Marland Kitchen Difficulty breathing  . Swelling of your face and throat  . A fast heartbeat  . A bad rash all over your body  . Dizziness and weakness    Immunizations Administered    Name Date Dose VIS Date Route   Pfizer COVID-19 Vaccine 04/10/2019  8:22 AM 0.3 mL 02/17/2019 Intramuscular   Manufacturer: [place]   Lot: [id number]   [place]: [id number]

## 2019-04-16 ENCOUNTER — Ambulatory Visit: Payer: Federal, State, Local not specified - PPO

## 2019-05-01 ENCOUNTER — Ambulatory Visit: Payer: Federal, State, Local not specified - PPO

## 2019-05-04 ENCOUNTER — Ambulatory Visit: Payer: Federal, State, Local not specified - PPO | Attending: Internal Medicine

## 2019-05-04 DIAGNOSIS — Z23 Encounter for immunization: Secondary | ICD-10-CM | POA: Insufficient documentation

## 2019-05-04 NOTE — Progress Notes (Signed)
   Covid-19 Vaccination Clinic  Name:  Maxwell Leonard    MRN: [id number] DOB: August 31, 1945  05/04/2019  [name] was observed post Covid-19 immunization for 15 minutes without incidence. He was provided with Vaccine Information Sheet and instruction to access the V-Safe system.   [name] was instructed to call 911 with any severe reactions post vaccine: Marland Kitchen Difficulty breathing  . Swelling of your face and throat  . A fast heartbeat  . A bad rash all over your body  . Dizziness and weakness    Immunizations Administered    Name Date Dose VIS Date Route   Pfizer COVID-19 Vaccine 05/04/2019  8:45 AM 0.3 mL 02/17/2019 Intramuscular   Manufacturer: [place]   Lot: [id number]   [place]: [id number]

## 2019-05-12 ENCOUNTER — Other Ambulatory Visit: Payer: Self-pay

## 2019-05-12 ENCOUNTER — Telehealth: Payer: Self-pay | Admitting: Orthopaedic Surgery

## 2019-05-12 MED ORDER — CELECOXIB 200 MG PO CAPS: ORAL_CAPSULE | ORAL | 2 refills | Status: DC

## 2019-05-12 NOTE — Telephone Encounter (Signed)
Pt came in and states he needs a refill of his celebrex 200 mg sent to his [facility] on [address].   [phone]

## 2019-05-12 NOTE — Telephone Encounter (Signed)
Called patient. No answer. Left message that script has been sent to pharmacy.

## 2019-05-12 NOTE — Telephone Encounter (Signed)
Please advise 

## 2019-05-12 NOTE — Telephone Encounter (Signed)
thanks

## 2019-07-10 ENCOUNTER — Encounter: Payer: Self-pay | Admitting: Family Medicine

## 2019-07-10 ENCOUNTER — Ambulatory Visit (INDEPENDENT_AMBULATORY_CARE_PROVIDER_SITE_OTHER): Payer: Federal, State, Local not specified - PPO | Admitting: Family Medicine

## 2019-07-10 ENCOUNTER — Other Ambulatory Visit: Payer: Self-pay

## 2019-07-10 VITALS — BP 122/60 | HR 71 | Temp 98.6°F | Ht 64.0 in | Wt 162.0 lb

## 2019-07-10 DIAGNOSIS — H60501 Unspecified acute noninfective otitis externa, right ear: Secondary | ICD-10-CM | POA: Diagnosis not present

## 2019-07-10 DIAGNOSIS — J449 Chronic obstructive pulmonary disease, unspecified: Secondary | ICD-10-CM | POA: Diagnosis not present

## 2019-07-10 DIAGNOSIS — J441 Chronic obstructive pulmonary disease with (acute) exacerbation: Secondary | ICD-10-CM

## 2019-07-10 DIAGNOSIS — Z87442 Personal history of urinary calculi: Secondary | ICD-10-CM | POA: Diagnosis not present

## 2019-07-10 MED ORDER — PREDNISONE 20 MG PO TABS: ORAL_TABLET | ORAL | 0 refills | Status: DC

## 2019-07-10 MED ORDER — DOXYCYCLINE HYCLATE 100 MG PO CAPS: 100.0000 mg | ORAL_CAPSULE | Freq: Two times a day (BID) | ORAL | 0 refills | Status: DC

## 2019-07-10 MED ORDER — NEOMYCIN-POLYMYXIN-HC 3.5-10000-1 OT SOLN: 4.0000 [drp] | Freq: Four times a day (QID) | OTIC | 0 refills | Status: DC

## 2019-07-10 NOTE — Patient Instructions (Signed)
Otitis Externa  Otitis externa is an infection of the outer ear canal. The outer ear canal is the area between the outside of the ear and the eardrum. Otitis externa is sometimes called swimmer's ear. What are the causes? Common causes of this condition include:  Swimming in dirty water.  Moisture in the ear.  An injury to the inside of the ear.  An object stuck in the ear.  A cut or scrape on the outside of the ear. What increases the risk? You are more likely to develop this condition if you go swimming often. What are the signs or symptoms? The first symptom of this condition is often itching in the ear. Later symptoms of the condition include:  Swelling of the ear.  Redness in the ear.  Ear pain. The pain may get worse when you pull on your ear.  Pus coming from the ear. How is this diagnosed? This condition may be diagnosed by examining the ear and testing fluid from the ear for bacteria and funguses. How is this treated? This condition may be treated with:  Antibiotic ear drops. These are often given for 10-14 days.  Medicines to reduce itching and swelling. Follow these instructions at home:  If you were prescribed antibiotic ear drops, use them as told by your health care provider. Do not stop using the antibiotic even if your condition improves.  Take over-the-counter and prescription medicines only as told by your health care provider.  Avoid getting water in your ears as told by your health care provider. This may include avoiding swimming or water sports for a few days.  Keep all follow-up visits as told by your health care provider. This is important. How is this prevented?  Keep your ears dry. Use the corner of a towel to dry your ears after you swim or bathe.  Avoid scratching or putting things in your ear. Doing these things can damage the ear canal or remove the protective wax that lines it, which makes it easier for bacteria and funguses to  grow.  Avoid swimming in lakes, polluted water, or pools that may not have enough chlorine. Contact a health care provider if:  You have a fever.  Your ear is still red, swollen, painful, or draining pus after 3 days.  Your redness, swelling, or pain gets worse.  You have a severe headache.  You have redness, swelling, pain, or tenderness in the area behind your ear. Summary  Otitis externa is an infection of the outer ear canal.  Common causes include swimming in dirty water, moisture in the ear, or a cut or scrape in the ear.  Symptoms include pain, redness, and swelling of the ear.  If you were prescribed antibiotic ear drops, use them as told by your health care provider. Do not stop using the antibiotic even if your condition improves. This information is not intended to replace advice given to you by your health care provider. Make sure you discuss any questions you have with your health care provider. Document Revised: 07/30/2017 Document Reviewed: 07/30/2017 Elsevier Patient Education  2020 Elsevier Inc.  

## 2019-07-10 NOTE — Progress Notes (Signed)
  Subjective:     Patient ID: Maxwell Leonard, male   DOB: [birth year], 74 y.o.   MRN: [id number]  HPI  [place] seen for the following acute issues  He complains of right ear pain.  Started last Thursday.  No some swelling of the canal.  He had some pain.  Took some Aleve and Saturday and Sunday symptoms were somewhat better.  He had severe swelling last week to the point he cannot lay on his right side.  He went to audiologist earlier today and they were concerned he had a "swimmer's ear ".  He also noticed some drainage on his pillow this morning.  This was somewhat purulent.  Little bit of blood noted.  No recent swimming.  He left his hearing aid out today.  Other issues he relates about 1 week history of some cough which is mostly nonproductive but occasionally productive of thick yellow sputum.  No hemoptysis.  He had some wheezing.  Chest x-rays going back several years ago showed some hyperinflation consistent with COPD-these were reviewed.  Longstanding history of nicotine use.  He had similar bronchial infection a year ago which responded favorably to doxycycline and prednisone.  Past Medical History:  Diagnosis Date  . Arthritis   . Kidney stones 2011   had kidney stones spring of the year and then previously 20 yrs    Past Surgical History:  Procedure Laterality Date  . TONSILLECTOMY      reports that he has been smoking cigarettes. He has been smoking about 0.50 packs per day. He has never used smokeless tobacco. He reports current alcohol use. He reports that he does not use drugs. family history includes Alzheimer's disease in his mother; Cancer (age of onset: 61) in his father; Healthy in his daughter and son. No Known Allergies  Review of Systems  Constitutional: Negative for chills and fever.  HENT: Positive for ear discharge and ear pain. Negative for facial swelling.   Respiratory: Positive for cough and wheezing. Negative for chest tightness and stridor.   Cardiovascular:  Negative for chest pain, palpitations and leg swelling.  Gastrointestinal: Negative for abdominal pain.       Objective:   Physical Exam Vitals reviewed.  Constitutional:      Appearance: Normal appearance.  HENT:     Ears:     Comments: Left canal and eardrum appears normal.  Right canal shows some inflammation fairly diffusely with mild erythema and mild swelling..  Eardrum is visualized and appears normal.  No visible perforation. Cardiovascular:     Rate and Rhythm: Normal rate and regular rhythm.  Pulmonary:     Comments: He has some diffuse expiratory wheezes.  Pulse oximetry 93%.  Symmetric breath sounds.  No respiratory distress. Musculoskeletal:     Cervical back: Neck supple.  Neurological:     Mental Status: He is alert.        Assessment:     #1 acute right otitis externa  #2 acute exacerbation of COPD.  Patient in no respiratory distress.  Ongoing nicotine use    Plan:     -Keep area as dry as possible -Start Cortisporin otic solution 3 to 4 drops right ear 4 times daily -Doxycycline 100 mg twice daily for 10 days -Prednisone 20 mg take 2 tablets once daily for 5 days -Continue albuterol as needed -Follow-up for any persistent or worsening symptoms -Encouraged to stop smoking  Eulas Post MD [facility]

## 2019-07-12 ENCOUNTER — Ambulatory Visit: Payer: Federal, State, Local not specified - PPO | Admitting: Family Medicine

## 2019-07-24 ENCOUNTER — Other Ambulatory Visit (INDEPENDENT_AMBULATORY_CARE_PROVIDER_SITE_OTHER): Payer: Self-pay | Admitting: Orthopedic Surgery

## 2019-07-24 NOTE — Telephone Encounter (Signed)
Please advise 

## 2019-08-14 ENCOUNTER — Telehealth: Payer: Self-pay | Admitting: Orthopaedic Surgery

## 2019-08-14 NOTE — Telephone Encounter (Signed)
Please advise if patient can get a refill of Celebrex.

## 2019-08-14 NOTE — Telephone Encounter (Signed)
Celebrex Rx refill

## 2019-08-14 NOTE — Telephone Encounter (Signed)
Tried to call patient back. No answer. Left message inquiring about which medication he is wanting a refill of. Ask him to call back.

## 2019-08-14 NOTE — Telephone Encounter (Signed)
Pt requested a refill of his medication.

## 2019-08-15 ENCOUNTER — Other Ambulatory Visit: Payer: Self-pay | Admitting: Orthopedic Surgery

## 2019-08-15 NOTE — Telephone Encounter (Signed)
Called and spoke with patient. Made him aware that he needed to go through his PCP due to recent labwork in January. He understands and is scheduled to see his PCP this week.

## 2019-08-17 ENCOUNTER — Other Ambulatory Visit: Payer: Self-pay

## 2019-08-18 ENCOUNTER — Ambulatory Visit (INDEPENDENT_AMBULATORY_CARE_PROVIDER_SITE_OTHER): Payer: Federal, State, Local not specified - PPO | Admitting: Family Medicine

## 2019-08-18 ENCOUNTER — Encounter: Payer: Self-pay | Admitting: Family Medicine

## 2019-08-18 VITALS — BP 120/62 | HR 76 | Temp 97.8°F | Wt 159.3 lb

## 2019-08-18 DIAGNOSIS — H6501 Acute serous otitis media, right ear: Secondary | ICD-10-CM | POA: Diagnosis not present

## 2019-08-18 MED ORDER — PREDNISONE 10 MG PO TABS: ORAL_TABLET | ORAL | 0 refills | Status: DC

## 2019-08-18 NOTE — Progress Notes (Signed)
Established Patient Office Visit  Subjective:  Patient ID: Maxwell Leonard, male    DOB: May 20, 1945  Age: 74 y.o. MRN: [id number]  CC:  Chief Complaint  Patient presents with  . Ear Pain    Pt states right ear has been bugging him still hasnt recovered from ear infection     HPI Maxwell Leonard presents for some ongoing right ear concerns.  He was seen here May 3.  He had some right ear pain at that time.  He had been to audiologist and they were concerned he had otitis externa.  He had noticed some drainage in the ear canal at that point.  Some of this was purulent. On exam he had evidence for normal left ear exam the right canal showed some inflammation and some diffuse erythema and swelling.  He had relatively normal-looking eardrum at that time.  He was placed on prednisone for some wheezing and doxycycline and Cortisporin otic suspension  His ear pain improved after couple weeks.  He went back to audiologist but was told he had some "congestion" in his ear.  They recommended Sudafed but he declined.  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 Arthritis   . Kidney stones 2011   had kidney stones spring of the year and then previously 20 yrs     Past Surgical History:  Procedure Laterality Date  . TONSILLECTOMY      Family History  Problem Relation Age of Onset  . Healthy Daughter   . Healthy Son   . Alzheimer's disease Mother   . Cancer Father 54       lung cancer    Social History   Socioeconomic History  . Marital status: Married    Spouse name: Not on file  . Number of children: Not on file  . Years of education: Not on file  . Highest education level: Not on file  Occupational History  . Not on file  Tobacco Use  . Smoking status: Current Every Day Smoker    Packs/day: 0.50    Types: Cigarettes  . Smokeless tobacco: Never Used  Vaping Use  . Vaping  Use: Never used  Substance and Sexual Activity  . Alcohol use: Yes  . Drug use: No  . Sexual activity: Not on file  Other Topics Concern  . Not on file  Social History Narrative  . Not on file   Social Determinants of Health   Financial Resource Strain:   . Difficulty of Paying Living Expenses:   Food Insecurity:   . Worried About Charity fundraiser in the Last Year:   . Arboriculturist in the Last Year:   Transportation Needs:   . Film/video editor (Medical):   Marland Kitchen Lack of Transportation (Non-Medical):   Physical Activity:   . Days of Exercise per Week:   . Minutes of Exercise per Session:   Stress:   . Feeling of Stress :   Social Connections:   . Frequency of Communication with Friends and Family:   . Frequency of Social Gatherings with Friends and Family:   . Attends Religious Services:   . Active Member of Clubs or Organizations:   . Attends Archivist Meetings:   Marland Kitchen Marital Status:   Intimate Partner Violence:   . Fear of Current or Ex-Partner:   . Emotionally  Abused:   Marland Kitchen Physically Abused:   . Sexually Abused:     Outpatient Medications Prior to Visit  Medication Sig Dispense Refill  . albuterol (PROVENTIL HFA;VENTOLIN HFA) 108 (90 Base) MCG/ACT inhaler Inhale 2 puffs into the lungs every 6 (six) hours as needed for wheezing or shortness of breath. 1 Inhaler 2  . aspirin 81 MG tablet Take 81 mg by mouth daily. Reported on 07/02/2015    . celecoxib (CELEBREX) 200 MG capsule 1 tablet daily 30 capsule 2  . diclofenac Sodium (VOLTAREN) 1 % GEL APPLY 2-4 G TOPICALLY 4 (FOUR) TIMES DAILY AS NEEDED. 500 g 1  . doxycycline (VIBRAMYCIN) 100 MG capsule Take 1 capsule (100 mg total) by mouth 2 (two) times daily. 20 capsule 0  . neomycin-polymyxin-hydrocortisone (CORTISPORIN) OTIC solution Place 4 drops into the right ear 4 (four) times daily. 10 mL 0  . tamsulosin (FLOMAX) 0.4 MG CAPS capsule TAKE 1 CAPSULE BY MOUTH EVERY DAY 30 capsule 0  . predniSONE  (DELTASONE) 20 MG tablet Take two tablets once daily for 5 days. 10 tablet 0   No facility-administered medications prior to visit.    No Known Allergies  ROS Review of Systems  Constitutional: Negative for chills and fever.  HENT: Positive for hearing loss. Negative for congestion, ear discharge and ear pain.       Objective:    Physical Exam Vitals reviewed.  Constitutional:      Appearance: Normal appearance.  HENT:     Ears:     Comments: Left eardrum appears normal.  Right eardrum is distorted with evidence for some bullous changes involving the eardrum and serous effusion.  He has a bit of crusted blood along the anterior superior portion of the eardrum but no obvious visible perforation.  Previously noted ear canal inflammation has resolved Cardiovascular:     Rate and Rhythm: Normal rate and regular rhythm.  Pulmonary:     Effort: Pulmonary effort is normal.     Breath sounds: Normal breath sounds.  Neurological:     Mental Status: He is alert.     BP 120/62 (BP Location: Left Arm, Patient Position: Sitting, Cuff Size: Normal)   Pulse 76   Temp 97.8 F (36.6 C) (Temporal)   Wt 159 lb 4.8 oz (72.3 kg)   SpO2 96%   BMI 27.34 kg/m  Wt Readings from Last 3 Encounters:  08/18/19 159 lb 4.8 oz (72.3 kg)  07/10/19 162 lb (73.5 kg)  03/14/19 164 lb (74.4 kg)     Health Maintenance Due  Topic Date Due  . Hepatitis C Screening  Never done  . TETANUS/TDAP  Never done  . COLONOSCOPY  Never done    There are no preventive care reminders to display for this patient.  Lab Results  Component Value Date   TSH 0.74 03/14/2019   Lab Results  Component Value Date   WBC 16.0 (H) 03/15/2019   HGB 13.5 03/15/2019   HCT 39.7 03/15/2019   MCV 93.2 03/15/2019   PLT 275 03/15/2019   Lab Results  Component Value Date   NA 137 03/15/2019   K 4.5 03/15/2019   CO2 28 03/15/2019   GLUCOSE 97 03/15/2019   BUN 21 03/15/2019   CREATININE 1.79 (H) 03/15/2019   BILITOT  0.8 03/14/2019   ALKPHOS 49 03/14/2019   AST 25 03/14/2019   ALT 21 03/14/2019   PROT 6.3 03/14/2019   ALBUMIN 4.3 03/14/2019   CALCIUM 9.1 03/15/2019   ANIONGAP 10  03/15/2019   GFR 45.63 (L) 03/14/2019   No results found for: CHOL No results found for: HDL No results found for: LDLCALC No results found for: TRIG No results found for: CHOLHDL No results found for: HGBA1C    Assessment & Plan:   Right serous effusion-no suppurative changes  -Prednisone taper -We explained these can sometimes take several weeks if not months to resolve -If not improved in 1 month recommend ENT referral  Meds ordered this encounter  Medications  . predniSONE (DELTASONE) 10 MG tablet    Sig: Taper as follows: 4-4-4-3-3-2-2-1-1    Dispense:  24 tablet    Refill:  0    Follow-up: No follow-ups on file.    Carolann Littler, MD

## 2019-08-18 NOTE — Patient Instructions (Signed)

## 2019-09-25 ENCOUNTER — Encounter: Payer: Self-pay | Admitting: Family Medicine

## 2019-09-25 ENCOUNTER — Ambulatory Visit: Payer: Federal, State, Local not specified - PPO | Admitting: Family Medicine

## 2019-09-25 ENCOUNTER — Other Ambulatory Visit: Payer: Self-pay

## 2019-09-25 VITALS — BP 120/50 | HR 74 | Temp 98.4°F | Ht 64.0 in | Wt 161.5 lb

## 2019-09-25 DIAGNOSIS — H6121 Impacted cerumen, right ear: Secondary | ICD-10-CM

## 2019-09-25 NOTE — Progress Notes (Signed)
Established Patient Office Visit  Subjective:  Patient ID: Maxwell Leonard, male    DOB: 1945/06/24  Age: 74 y.o. MRN: [id number]  CC:  Chief Complaint  Patient presents with  . Ear Pain    patient complains of "bubbles"in the right ear    HPI Maxwell Leonard presents for reassessment of right ear.  Refer to note from 08/18/2019.  Exam of the right ear at that time revealed some distortion of the right eardrum with some bullous changes and suggestion of serous effusion.  He had little bit of crusted blood along the anterior superior portion of the eardrum but no obvious perforation.  He took a prednisone taper at this point has no ear pain whatsoever.  He feels his right hearing aid is not working quite as well as the left.  He has not seen audiologist since he was here in June.  He has had no drainage from the ear  Past Medical History:  Diagnosis Date  . Arthritis   . Kidney stones 2011   had kidney stones spring of the year and then previously 20 yrs     Past Surgical History:  Procedure Laterality Date  . TONSILLECTOMY      Family History  Problem Relation Age of Onset  . Healthy Daughter   . Healthy Son   . Alzheimer's disease Mother   . Cancer Father 63       lung cancer    Social History   Socioeconomic History  . Marital status: Married    Spouse name: Not on file  . Number of children: Not on file  . Years of education: Not on file  . Highest education level: Not on file  Occupational History  . Not on file  Tobacco Use  . Smoking status: Current Every Day Smoker    Packs/day: 0.50    Types: Cigarettes  . Smokeless tobacco: Never Used  Vaping Use  . Vaping Use: Never used  Substance and Sexual Activity  . Alcohol use: Yes  . Drug use: No  . Sexual activity: Not on file  Other Topics Concern  . Not on file  Social History Narrative  . Not on file   Social Determinants of Health   Financial Resource Strain:   . Difficulty of Paying Living  Expenses:   Food Insecurity:   . Worried About Charity fundraiser in the Last Year:   . Arboriculturist in the Last Year:   Transportation Needs:   . Film/video editor (Medical):   Marland Kitchen Lack of Transportation (Non-Medical):   Physical Activity:   . Days of Exercise per Week:   . Minutes of Exercise per Session:   Stress:   . Feeling of Stress :   Social Connections:   . Frequency of Communication with Friends and Family:   . Frequency of Social Gatherings with Friends and Family:   . Attends Religious Services:   . Active Member of Clubs or Organizations:   . Attends Archivist Meetings:   Marland Kitchen Marital Status:   Intimate Partner Violence:   . Fear of Current or Ex-Partner:   . Emotionally Abused:   Marland Kitchen Physically Abused:   . Sexually Abused:     Outpatient Medications Prior to Visit  Medication Sig Dispense Refill  . albuterol (PROVENTIL HFA;VENTOLIN HFA) 108 (90 Base) MCG/ACT inhaler Inhale 2 puffs into the lungs every 6 (six) hours as needed for wheezing or shortness of breath. 1  Inhaler 2  . aspirin 81 MG tablet Take 81 mg by mouth daily. Reported on 07/02/2015    . celecoxib (CELEBREX) 200 MG capsule 1 tablet daily 30 capsule 2  . diclofenac Sodium (VOLTAREN) 1 % GEL APPLY 2-4 G TOPICALLY 4 (FOUR) TIMES DAILY AS NEEDED. 500 g 1  . neomycin-polymyxin-hydrocortisone (CORTISPORIN) OTIC solution Place 4 drops into the right ear 4 (four) times daily. 10 mL 0  . tamsulosin (FLOMAX) 0.4 MG CAPS capsule TAKE 1 CAPSULE BY MOUTH EVERY DAY 30 capsule 0  . doxycycline (VIBRAMYCIN) 100 MG capsule Take 1 capsule (100 mg total) by mouth 2 (two) times daily. 20 capsule 0  . predniSONE (DELTASONE) 10 MG tablet Taper as follows: 4-4-4-3-3-2-2-1-1 24 tablet 0   No facility-administered medications prior to visit.    No Known Allergies  ROS Review of Systems  HENT: Positive for hearing loss. Negative for ear discharge and ear pain.   Neurological: Negative for dizziness and  headaches.      Objective:    Physical Exam Vitals reviewed.  Constitutional:      Appearance: Normal appearance.  HENT:     Ears:     Comments: Left eardrum appears normal.  He has some cerumen in the right canal.  After couple attempts at removal and combination of irrigation and curette we were able to remove most of the cerumen.  Previous bullous changes of the right eardrum have resolved.  Minimal irritation and erythema in the canal from irrigation but eardrum reveals no acute findings Neurological:     Mental Status: He is alert.     BP (!) 120/50 (BP Location: Left Arm, Patient Position: Sitting, Cuff Size: Normal)   Pulse 74   Temp 98.4 F (36.9 C) (Oral)   Ht 5\' 4"  (1.626 m)   Wt 161 lb 8 oz (73.3 kg)   BMI 27.72 kg/m  Wt Readings from Last 3 Encounters:  09/25/19 161 lb 8 oz (73.3 kg)  08/18/19 159 lb 4.8 oz (72.3 kg)  07/10/19 162 lb (73.5 kg)     Health Maintenance Due  Topic Date Due  . Hepatitis C Screening  Never done  . TETANUS/TDAP  Never done  . COLONOSCOPY  Never done    There are no preventive care reminders to display for this patient.  Lab Results  Component Value Date   TSH 0.74 03/14/2019   Lab Results  Component Value Date   WBC 16.0 (H) 03/15/2019   HGB 13.5 03/15/2019   HCT 39.7 03/15/2019   MCV 93.2 03/15/2019   PLT 275 03/15/2019   Lab Results  Component Value Date   NA 137 03/15/2019   K 4.5 03/15/2019   CO2 28 03/15/2019   GLUCOSE 97 03/15/2019   BUN 21 03/15/2019   CREATININE 1.79 (H) 03/15/2019   BILITOT 0.8 03/14/2019   ALKPHOS 49 03/14/2019   AST 25 03/14/2019   ALT 21 03/14/2019   PROT 6.3 03/14/2019   ALBUMIN 4.3 03/14/2019   CALCIUM 9.1 03/15/2019   ANIONGAP 10 03/15/2019   GFR 45.63 (L) 03/14/2019   No results found for: CHOL No results found for: HDL No results found for: LDLCALC No results found for: TRIG No results found for: CHOLHDL No results found for: HGBA1C    Assessment & Plan:   #1  cerumen right ear canal.  Discussed risk and benefits of irrigation including risk of bleeding, pain, low risk of perforation and patient consented.  After multiple attempts at irrigation we  were able to remove cerumen and there is one residual piece that was removed with curette.  Patient did reapply his hearing aid and was hearing better afterwards. Previous serous otitis changes on exam appear to be resolving  -We suggest that as a first step be check with audiologist if he has any further concerns regarding his hearing acuity or hearing aids.   No orders of the defined types were placed in this encounter.   Follow-up: No follow-ups on file.    Carolann Littler, MD

## 2019-09-25 NOTE — Patient Instructions (Signed)
Eardrum looks better.   Looks like effusion is clearing  Would check with audiologist if hearing is still impaired.

## 2019-10-19 ENCOUNTER — Other Ambulatory Visit: Payer: Self-pay

## 2019-10-19 ENCOUNTER — Other Ambulatory Visit: Payer: Federal, State, Local not specified - PPO

## 2019-10-19 DIAGNOSIS — Z20822 Contact with and (suspected) exposure to covid-19: Secondary | ICD-10-CM

## 2019-10-20 LAB — NOVEL CORONAVIRUS, NAA: SARS-CoV-2, NAA: DETECTED — AB

## 2019-10-20 LAB — SARS-COV-2, NAA 2 DAY TAT

## 2019-10-21 ENCOUNTER — Telehealth: Payer: Self-pay | Admitting: Family Medicine

## 2019-10-21 NOTE — Telephone Encounter (Signed)
Pt called to say he is vaccinated so did not know if that made the quarantine rules different. I had previously left him a MyChart message with instructions about precautions. I reviewed instructions with him as well as helped determine the date his quarantine will be over if no fever and resp sx improving. Verbalized understanding, awaiting his wife's results.

## 2019-10-23 ENCOUNTER — Ambulatory Visit (HOSPITAL_COMMUNITY)
Admission: RE | Admit: 2019-10-23 | Discharge: 2019-10-23 | Disposition: A | Discharge status: Home / Self Care | Payer: Federal, State, Local not specified - PPO | Source: Ambulatory Visit | Attending: Pulmonary Disease | Admitting: Pulmonary Disease

## 2019-10-23 ENCOUNTER — Other Ambulatory Visit: Payer: Self-pay | Admitting: Infectious Diseases

## 2019-10-23 DIAGNOSIS — R54 Age-related physical debility: Secondary | ICD-10-CM

## 2019-10-23 DIAGNOSIS — U071 COVID-19: Secondary | ICD-10-CM

## 2019-10-23 MED ORDER — METHYLPREDNISOLONE SODIUM SUCC 125 MG IJ SOLR: 125.0000 mg | Freq: Once | INTRAMUSCULAR | Status: DC | PRN

## 2019-10-23 MED ORDER — EPINEPHRINE 0.3 MG/0.3ML IJ SOAJ: 0.3000 mg | Freq: Once | INTRAMUSCULAR | Status: DC | PRN

## 2019-10-23 MED ORDER — ALBUTEROL SULFATE HFA 108 (90 BASE) MCG/ACT IN AERS: 2.0000 | INHALATION_SPRAY | Freq: Once | RESPIRATORY_TRACT | Status: DC | PRN

## 2019-10-23 MED ORDER — DIPHENHYDRAMINE HCL 50 MG/ML IJ SOLN: 50.0000 mg | Freq: Once | INTRAMUSCULAR | Status: DC | PRN

## 2019-10-23 MED ORDER — FAMOTIDINE IN NACL 20-0.9 MG/50ML-% IV SOLN: 20.0000 mg | Freq: Once | INTRAVENOUS | Status: DC | PRN

## 2019-10-23 MED ORDER — SODIUM CHLORIDE 0.9 % IV SOLN
1200.0000 mg | Freq: Once | INTRAVENOUS | Status: AC
  Administered 2019-10-23: 1200 mg via INTRAVENOUS
  Filled 2019-10-23: qty 10

## 2019-10-23 MED ORDER — SODIUM CHLORIDE 0.9 % IV SOLN: INTRAVENOUS | Status: DC | PRN

## 2019-10-23 NOTE — Progress Notes (Signed)
I connected by phone with Maxwell Leonard on 10/23/2019 at 8:36 AM to discuss the potential use of a new treatment for mild to moderate COVID-19 viral infection in non-hospitalized patients.  This patient is a 74 y.o. male that meets the FDA criteria for Emergency Use Authorization of COVID monoclonal antibody casirivimab/imdevimab.  Has a (+) direct SARS-CoV-2 viral test result  Has mild or moderate COVID-19   Is NOT hospitalized due to COVID-19  Is within 10 days of symptom onset  Has at least one of the high risk factor(s) for progression to severe COVID-19 and/or hospitalization as defined in EUA.  Specific high risk criteria : Older age (>/= 75 yo)   I have spoken and communicated the following to the patient or parent/caregiver regarding COVID monoclonal antibody treatment:  1. FDA has authorized the emergency use for the treatment of mild to moderate COVID-19 in adults and pediatric patients with positive results of direct SARS-CoV-2 viral testing who are 19 years of age and older weighing at least 40 kg, and who are at high risk for progressing to severe COVID-19 and/or hospitalization.  2. The significant known and potential risks and benefits of COVID monoclonal antibody, and the extent to which such potential risks and benefits are unknown.  3. Information on available alternative treatments and the risks and benefits of those alternatives, including clinical trials.  4. Patients treated with COVID monoclonal antibody should continue to self-isolate and use infection control measures (e.g., wear mask, isolate, social distance, avoid sharing personal items, clean and disinfect high touch surfaces, and frequent handwashing) according to CDC guidelines.   5. The patient or parent/caregiver has the option to accept or refuse COVID monoclonal antibody treatment.  After reviewing this information with the patient, The patient agreed to proceed with receiving casirivimab\imdevimab  infusion and will be provided a copy of the Fact sheet prior to receiving the infusion. Janene Madeira 10/23/2019 8:36 AM

## 2019-10-23 NOTE — Discharge Instructions (Signed)
10 Things You Can Do to Manage Your COVID-19 Symptoms at Home If you have possible or confirmed COVID-19: 1. Stay home from work and school. And stay away from other public places. If you must go out, avoid using any kind of public transportation, ridesharing, or taxis. 2. Monitor your symptoms carefully. If your symptoms get worse, call your healthcare provider immediately. 3. Get rest and stay hydrated. 4. If you have a medical appointment, call the healthcare provider ahead of time and tell them that you have or may have COVID-19. 5. For medical emergencies, call 911 and notify the dispatch personnel that you have or may have COVID-19. 6. Cover your cough and sneezes with a tissue or use the inside of your elbow. 7. Wash your hands often with soap and water for at least 20 seconds or clean your hands with an alcohol-based hand sanitizer that contains at least 60% alcohol. 8. As much as possible, stay in a specific room and away from other people in your home. Also, you should use a separate bathroom, if available. If you need to be around other people in or outside of the home, wear a mask. 9. Avoid sharing personal items with other people in your household, like dishes, towels, and bedding. 10. Clean all surfaces that are touched often, like counters, tabletops, and doorknobs. Use household cleaning sprays or wipes according to the label instructions. [email or web address] Mr. 09/07/2018 This information is not intended to replace advice given to you by your health care provider. Make sure you discuss any questions you have with your health care provider. Document Revised: 02/09/2019 Document Reviewed: 02/09/2019 Elsevier Patient Education  2020 [employer]. Mr.

## 2019-10-23 NOTE — Progress Notes (Signed)
  Diagnosis: COVID-19  Physician: Asencion Noble Procedure: [facility] Med: casirivimab\imdevimab infusion - Provided patient with casirivimab\imdevimab fact sheet for patients, parents and caregivers prior to infusion.  Complications: No immediate complications noted.  Discharge: Discharged home   Heide Scales 10/23/2019

## 2019-10-26 ENCOUNTER — Telehealth: Payer: Self-pay

## 2019-10-26 NOTE — Telephone Encounter (Signed)
Patient is requesting a refill of celebrex because his tablets have expired. Please advise.   [facility]

## 2019-10-28 NOTE — Telephone Encounter (Signed)
Ok to renew-needs CBC and BMET to be sure no side effects from the [employer]

## 2019-10-30 ENCOUNTER — Other Ambulatory Visit: Payer: Self-pay

## 2019-10-30 MED ORDER — CELECOXIB 200 MG PO CAPS: ORAL_CAPSULE | ORAL | 2 refills | Status: DC

## 2019-10-30 NOTE — Telephone Encounter (Signed)
Called patient and told him that his script has been sent to his pharmacy.I also advised him that Dr.Whitfield would like to check some labwork on him before he gets another refill.

## 2019-10-31 ENCOUNTER — Telehealth: Payer: Self-pay

## 2019-10-31 NOTE — Telephone Encounter (Signed)
Tried to call patient regarding Celebrex prescription refill. No answer. Left message on machine. In order to have refill his insurance is requiring a prior authorization due to insurance only allowing 180 pills within 365days. We do not have a current insurance card on file. I explained on voicemail that we need an update insurance card scanned into the system for me to do a prior authorization. He can stop by whenever its convenient for him. I ask him to call if he has any further questions.

## 2019-11-01 ENCOUNTER — Telehealth: Payer: Self-pay | Admitting: Orthopaedic Surgery

## 2019-11-01 NOTE — Telephone Encounter (Signed)
Patient called to speak with [name]. Patient wanted to set appt for blood work and medication call in. Read notes from [place] and explained we need updated insurance card. Patient states to call back and speak with [name] another time. Patient phone number is [phone] work or cell [phone].

## 2019-12-05 ENCOUNTER — Other Ambulatory Visit: Payer: Self-pay

## 2019-12-06 ENCOUNTER — Encounter: Payer: Self-pay | Admitting: Family Medicine

## 2019-12-06 ENCOUNTER — Ambulatory Visit (INDEPENDENT_AMBULATORY_CARE_PROVIDER_SITE_OTHER): Payer: Federal, State, Local not specified - PPO | Admitting: Family Medicine

## 2019-12-06 VITALS — BP 112/60 | HR 78 | Temp 98.1°F | Ht 64.0 in | Wt 158.9 lb

## 2019-12-06 DIAGNOSIS — H6521 Chronic serous otitis media, right ear: Secondary | ICD-10-CM

## 2019-12-06 MED ORDER — PREDNISONE 10 MG PO TABS: ORAL_TABLET | ORAL | 0 refills | Status: DC

## 2019-12-06 NOTE — Progress Notes (Signed)
Established Patient Office Visit  Subjective:  Patient ID: Maxwell Leonard, male    DOB: January 08, 1946  Age: 74 y.o. MRN: [id number]  CC:  Chief Complaint  Patient presents with  . Ear Pain    right ear pain, used drops and helped some    HPI Maxwell Leonard presents for recurrent right ear symptoms.  Has had some recent cerumen and was recently placed on Cortisporin otic drops.  [place] and he states that after returning he had some right ear fullness.  He had some pain last weekend and had some leftover Cortisporin drops which he applied and he states his ear symptoms are better at this time.  He has chronic bilateral sensorineural hearing loss and has hearing aids which he uses bilaterally.  No vertigo.  No ear drainage.  No nasal congestion.  He did have positive Covid test back in August.  He had positive exposure to someone with Covid.  He was basically asymptomatic.  He had been vaccinated.  He was contacted by the infusion center and underwent monoclonal antibody infusion and basically never developed any major symptoms.  Past Medical History:  Diagnosis Date  . Arthritis   . Kidney stones 2011   had kidney stones spring of the year and then previously 20 yrs     Past Surgical History:  Procedure Laterality Date  . TONSILLECTOMY      Family History  Problem Relation Age of Onset  . Healthy Daughter   . Healthy Son   . Alzheimer's disease Mother   . Cancer Father 79       lung cancer    Social History   Socioeconomic History  . Marital status: Married    Spouse name: Not on file  . Number of children: Not on file  . Years of education: Not on file  . Highest education level: Not on file  Occupational History  . Not on file  Tobacco Use  . Smoking status: Current Every Day Smoker    Packs/day: 0.50    Types: Cigarettes  . Smokeless tobacco: Never Used  Vaping Use  . Vaping Use: Never used  Substance and Sexual Activity  .  Alcohol use: Yes  . Drug use: No  . Sexual activity: Not on file  Other Topics Concern  . Not on file  Social History Narrative  . Not on file   Social Determinants of Health   Financial Resource Strain:   . Difficulty of Paying Living Expenses: Not on file  Food Insecurity:   . Worried About Charity fundraiser in the Last Year: Not on file  . Ran Out of Food in the Last Year: Not on file  Transportation Needs:   . Lack of Transportation (Medical): Not on file  . Lack of Transportation (Non-Medical): Not on file  Physical Activity:   . Days of Exercise per Week: Not on file  . Minutes of Exercise per Session: Not on file  Stress:   . Feeling of Stress : Not on file  Social Connections:   . Frequency of Communication with Friends and Family: Not on file  . Frequency of Social Gatherings with Friends and Family: Not on file  . Attends Religious Services: Not on file  . Active Member of Clubs or Organizations: Not on file  . Attends Archivist Meetings: Not on file  . Marital Status: Not on file  Intimate Partner Violence:   .  Fear of Current or Ex-Partner: Not on file  . Emotionally Abused: Not on file  . Physically Abused: Not on file  . Sexually Abused: Not on file    Outpatient Medications Prior to Visit  Medication Sig Dispense Refill  . albuterol (PROVENTIL HFA;VENTOLIN HFA) 108 (90 Base) MCG/ACT inhaler Inhale 2 puffs into the lungs every 6 (six) hours as needed for wheezing or shortness of breath. 1 Inhaler 2  . aspirin 81 MG tablet Take 81 mg by mouth daily. Reported on 07/02/2015    . celecoxib (CELEBREX) 200 MG capsule 1 tablet daily 30 capsule 2  . diclofenac Sodium (VOLTAREN) 1 % GEL APPLY 2-4 G TOPICALLY 4 (FOUR) TIMES DAILY AS NEEDED. 500 g 1  . neomycin-polymyxin-hydrocortisone (CORTISPORIN) OTIC solution Place 4 drops into the right ear 4 (four) times daily. 10 mL 0  . tamsulosin (FLOMAX) 0.4 MG CAPS capsule TAKE 1 CAPSULE BY MOUTH EVERY DAY 30  capsule 0  . augmented betamethasone dipropionate (DIPROLENE-AF) 0.05 % ointment Apply topically 2 (two) times daily. (Patient not taking: Reported on 12/06/2019)     No facility-administered medications prior to visit.    No Known Allergies  ROS Review of Systems  Constitutional: Negative for chills and fever.  HENT: Positive for ear pain. Negative for ear discharge, sinus pressure and sinus pain.       Objective:    Physical Exam Vitals reviewed.  Constitutional:      Appearance: Normal appearance.  HENT:     Ears:     Comments: He does not have any significant cerumen in either eardrum.  Left eardrum is normal.  Right eardrum reveals small serous effusion.  No perforation. Cardiovascular:     Rate and Rhythm: Normal rate and regular rhythm.  Pulmonary:     Effort: Pulmonary effort is normal.     Breath sounds: Normal breath sounds.  Neurological:     Mental Status: He is alert.     BP 112/60   Pulse 78   Temp 98.1 F (36.7 C) (Oral)   Ht 5\' 4"  (1.626 m)   Wt 158 lb 14.4 oz (72.1 kg)   SpO2 97%   BMI 27.28 kg/m  Wt Readings from Last 3 Encounters:  12/06/19 158 lb 14.4 oz (72.1 kg)  09/25/19 161 lb 8 oz (73.3 kg)  08/18/19 159 lb 4.8 oz (72.3 kg)     Health Maintenance Due  Topic Date Due  . Hepatitis C Screening  Never done  . TETANUS/TDAP  Never done  . COLONOSCOPY  Never done    There are no preventive care reminders to display for this patient.  Lab Results  Component Value Date   TSH 0.74 03/14/2019   Lab Results  Component Value Date   WBC 16.0 (H) 03/15/2019   HGB 13.5 03/15/2019   HCT 39.7 03/15/2019   MCV 93.2 03/15/2019   PLT 275 03/15/2019   Lab Results  Component Value Date   NA 137 03/15/2019   K 4.5 03/15/2019   CO2 28 03/15/2019   GLUCOSE 97 03/15/2019   BUN 21 03/15/2019   CREATININE 1.79 (H) 03/15/2019   BILITOT 0.8 03/14/2019   ALKPHOS 49 03/14/2019   AST 25 03/14/2019   ALT 21 03/14/2019   PROT 6.3 03/14/2019    ALBUMIN 4.3 03/14/2019   CALCIUM 9.1 03/15/2019   ANIONGAP 10 03/15/2019   GFR 45.63 (L) 03/14/2019   No results found for: CHOL No results found for: HDL No results found for:  [place] No results found for: TRIG No results found for: CHOLHDL No results found for: HGBA1C    Assessment & Plan:   Right otitis media with effusion.  He has evidence of some serous effusion on exam otherwise normal exam  -We explained this may take several weeks to resolve. -Patient felt prednisone helped once before.  We agreed to prednisone taper.  If symptoms not fully clear in the next month recommend ENT referral  Meds ordered this encounter  Medications  . predniSONE (DELTASONE) 10 MG tablet    Sig: Taper as follows: 4-4-4-3-3-2-2-1-1    Dispense:  24 tablet    Refill:  0    Follow-up: No follow-ups on file.    Carolann Littler, MD

## 2019-12-06 NOTE — Patient Instructions (Signed)

## 2020-06-17 ENCOUNTER — Other Ambulatory Visit: Payer: Self-pay

## 2020-06-17 ENCOUNTER — Encounter: Payer: Self-pay | Admitting: Family Medicine

## 2020-06-17 ENCOUNTER — Ambulatory Visit: Payer: Federal, State, Local not specified - PPO | Admitting: Family Medicine

## 2020-06-17 VITALS — BP 130/70 | HR 87 | Temp 98.0°F | Wt 158.4 lb

## 2020-06-17 DIAGNOSIS — J441 Chronic obstructive pulmonary disease with (acute) exacerbation: Secondary | ICD-10-CM | POA: Diagnosis not present

## 2020-06-17 MED ORDER — DOXYCYCLINE HYCLATE 100 MG PO CAPS: 100.0000 mg | ORAL_CAPSULE | Freq: Two times a day (BID) | ORAL | 0 refills | Status: DC

## 2020-06-17 MED ORDER — PREDNISONE 20 MG PO TABS: ORAL_TABLET | ORAL | 0 refills | Status: DC

## 2020-06-17 MED ORDER — ALBUTEROL SULFATE HFA 108 (90 BASE) MCG/ACT IN AERS: 2.0000 | INHALATION_SPRAY | Freq: Four times a day (QID) | RESPIRATORY_TRACT | 2 refills | Status: DC | PRN

## 2020-06-17 NOTE — Patient Instructions (Signed)

## 2020-06-17 NOTE — Progress Notes (Signed)
Established Patient Office Visit  Subjective:  Patient ID: Maxwell Leonard, male    DOB: 05/16/1945  Age: 75 y.o. MRN: [id number]  CC:  Chief Complaint  Patient presents with  . Cough    X 3 days, productive cough, runny nose, congestion, taking mucinex    HPI Maxwell Leonard presents for 3-day history of cough with some nasal congestion.  He is aware of some wheezing.  He has known COPD and still smokes about 3 to 4 cigarettes/day.  He has had similar exacerbations in the past.  He has no significant dyspnea at rest.  He worked earlier today.  No fever.  Taking over-the-counter Mucinex.  Has had some postnasal drip symptoms.  He has albuterol inhaler but this is out of date and requesting refills.  Denies any hemoptysis.  No recent appetite or weight changes.  His other chronic problems include history of osteoarthritis and history of kidney stones.  He has BPH and takes Flomax for that.  Past Medical History:  Diagnosis Date  . Arthritis   . Kidney stones 2011   had kidney stones spring of the year and then previously 20 yrs     Past Surgical History:  Procedure Laterality Date  . TONSILLECTOMY      Family History  Problem Relation Age of Onset  . Healthy Daughter   . Healthy Son   . Alzheimer's disease Mother   . Cancer Father 30       lung cancer    Social History   Socioeconomic History  . Marital status: Married    Spouse name: Not on file  . Number of children: Not on file  . Years of education: Not on file  . Highest education level: Not on file  Occupational History  . Not on file  Tobacco Use  . Smoking status: Current Every Day Smoker    Packs/day: 0.50    Types: Cigarettes  . Smokeless tobacco: Never Used  Vaping Use  . Vaping Use: Never used  Substance and Sexual Activity  . Alcohol use: Yes  . Drug use: No  . Sexual activity: Not on file  Other Topics Concern  . Not on file  Social History Narrative  . Not on file   Social Determinants of  Health   Financial Resource Strain: Not on file  Food Insecurity: Not on file  Transportation Needs: Not on file  Physical Activity: Not on file  Stress: Not on file  Social Connections: Not on file  Intimate Partner Violence: Not on file    Outpatient Medications Prior to Visit  Medication Sig Dispense Refill  . aspirin 81 MG tablet Take 81 mg by mouth daily. Reported on 07/02/2015    . augmented betamethasone dipropionate (DIPROLENE-AF) 0.05 % ointment Apply topically 2 (two) times daily.    . celecoxib (CELEBREX) 200 MG capsule 1 tablet daily 30 capsule 2  . diclofenac Sodium (VOLTAREN) 1 % GEL APPLY 2-4 G TOPICALLY 4 (FOUR) TIMES DAILY AS NEEDED. 500 g 1  . neomycin-polymyxin-hydrocortisone (CORTISPORIN) OTIC solution Place 4 drops into the right ear 4 (four) times daily. 10 mL 0  . tamsulosin (FLOMAX) 0.4 MG CAPS capsule TAKE 1 CAPSULE BY MOUTH EVERY DAY 30 capsule 0  . albuterol (PROVENTIL HFA;VENTOLIN HFA) 108 (90 Base) MCG/ACT inhaler Inhale 2 puffs into the lungs every 6 (six) hours as needed for wheezing or shortness of breath. 1 Inhaler 2  . predniSONE (DELTASONE) 10 MG tablet Taper as follows: 4-4-4-3-3-2-2-1-1  24 tablet 0   No facility-administered medications prior to visit.    No Known Allergies  ROS Review of Systems  Constitutional: Negative for chills and fever.  HENT: Positive for congestion. Negative for sore throat.   Respiratory: Positive for cough and wheezing.   Cardiovascular: Negative for chest pain, palpitations and leg swelling.      Objective:    Physical Exam Vitals reviewed.  Constitutional:      Appearance: Normal appearance.  Cardiovascular:     Rate and Rhythm: Normal rate and regular rhythm.  Pulmonary:     Effort: No respiratory distress.     Breath sounds: Wheezing present. No rales.  Musculoskeletal:     Cervical back: Neck supple.     Right lower leg: No edema.     Left lower leg: No edema.  Lymphadenopathy:     Cervical: No  cervical adenopathy.  Neurological:     Mental Status: He is alert.     BP 130/70 (BP Location: Left Arm, Patient Position: Sitting, Cuff Size: Normal)   Pulse 87   Temp 98 F (36.7 C) (Oral)   Wt 158 lb 6.4 oz (71.8 kg)   SpO2 94%   BMI 27.19 kg/m  Wt Readings from Last 3 Encounters:  06/17/20 158 lb 6.4 oz (71.8 kg)  12/06/19 158 lb 14.4 oz (72.1 kg)  09/25/19 161 lb 8 oz (73.3 kg)     Health Maintenance Due  Topic Date Due  . Hepatitis C Screening  Never done  . TETANUS/TDAP  Never done    There are no preventive care reminders to display for this patient.  Lab Results  Component Value Date   TSH 0.74 03/14/2019   Lab Results  Component Value Date   WBC 16.0 (H) 03/15/2019   HGB 13.5 03/15/2019   HCT 39.7 03/15/2019   MCV 93.2 03/15/2019   PLT 275 03/15/2019   Lab Results  Component Value Date   NA 137 03/15/2019   K 4.5 03/15/2019   CO2 28 03/15/2019   GLUCOSE 97 03/15/2019   BUN 21 03/15/2019   CREATININE 1.79 (H) 03/15/2019   BILITOT 0.8 03/14/2019   ALKPHOS 49 03/14/2019   AST 25 03/14/2019   ALT 21 03/14/2019   PROT 6.3 03/14/2019   ALBUMIN 4.3 03/14/2019   CALCIUM 9.1 03/15/2019   ANIONGAP 10 03/15/2019   GFR 45.63 (L) 03/14/2019   No results found for: CHOL No results found for: HDL No results found for: LDLCALC No results found for: TRIG No results found for: CHOLHDL No results found for: HGBA1C    Assessment & Plan:   Acute exacerbation of COPD.  Patient in no respiratory distress currently.  Pulse oximetry 94%.  He is responded in the past to similar flareups with combination of albuterol, antibiotics, and prednisone  -Refill albuterol for as needed use -Prednisone 20 mg take 2 tablets daily for 5 days -Doxycycline 100 mg twice daily for 10 days -Follow-up promptly for any fever or increased shortness of breath or other concerns  Meds ordered this encounter  Medications  . albuterol (VENTOLIN HFA) 108 (90 Base) MCG/ACT inhaler     Sig: Inhale 2 puffs into the lungs every 6 (six) hours as needed for wheezing or shortness of breath.    Dispense:  1 each    Refill:  2  . predniSONE (DELTASONE) 20 MG tablet    Sig: Take two tablets by mouth once daily with food for 5 days  Dispense:  10 tablet    Refill:  0  . doxycycline (VIBRAMYCIN) 100 MG capsule    Sig: Take 1 capsule (100 mg total) by mouth 2 (two) times daily.    Dispense:  20 capsule    Refill:  0    Follow-up: No follow-ups on file.    Carolann Littler, MD

## 2020-08-15 ENCOUNTER — Ambulatory Visit: Payer: Federal, State, Local not specified - PPO | Admitting: Orthopaedic Surgery

## 2020-08-15 ENCOUNTER — Other Ambulatory Visit: Payer: Self-pay

## 2020-08-15 ENCOUNTER — Encounter: Payer: Self-pay | Admitting: Orthopaedic Surgery

## 2020-08-15 ENCOUNTER — Ambulatory Visit: Payer: Self-pay

## 2020-08-15 DIAGNOSIS — M1711 Unilateral primary osteoarthritis, right knee: Secondary | ICD-10-CM

## 2020-08-15 DIAGNOSIS — M25561 Pain in right knee: Secondary | ICD-10-CM

## 2020-08-15 MED ORDER — CELECOXIB 200 MG PO CAPS: ORAL_CAPSULE | ORAL | 2 refills | Status: DC

## 2020-08-15 NOTE — Progress Notes (Signed)
Office Visit Note   Patient: Maxwell Leonard           Date of Birth: 21-Jul-1945           MRN: [id number] Visit Date: 08/15/2020              Requested by: Eulas Post, MD [place],  [place] [address] PCP: Eulas Post, MD   Assessment & Plan: Visit Diagnoses:  1. Right knee pain, unspecified chronicity   2. Unilateral primary osteoarthritis, right knee     Plan: Joziyah has osteoarthritis in his right knee with slight progression compared to films in 2018.  He has had some stiffness and soreness early in the morning and with certain activities but did not appear to have an effusion.  He is taken Celebrex in the past with excellent relief.  We will renew this.  He only takes it once or twice a week.  Long discussion regarding his diagnosis and treatment options.  At this point at such a minimal convenience that I think is fine just continuing with his exercises and occasional Celebrex.  He does not appear to have any limitation of his activities  Follow-Up Instructions: Return if symptoms worsen or fail to improve.   Orders:  Orders Placed This Encounter  Procedures   XR KNEE 3 VIEW RIGHT   Meds ordered this encounter  Medications   celecoxib (CELEBREX) 200 MG capsule    Sig: 1 tablet daily    Dispense:  30 capsule    Refill:  2      Procedures: No procedures performed   Clinical Data: No additional findings.   Subjective: Chief Complaint  Patient presents with   Right Knee - Pain    C/o stiffness and tightness, exercises seem to help it.   Seen in 2018 for evaluation of right knee pain with films demonstrating some mild arthritis.  Has experienced some stiffness in the morning and with certain activities but nothing that interferes with his activities.  Has not had any numbness or tingling or hip pain.  Has taken Celebrex over time with excellent relief but only taken as needed rather than on a daily basis.  No injury or trauma.  Sleeps  without difficulty  HPI  Review of Systems   Objective: Vital Signs: There were no vitals taken for this visit.  Physical Exam Constitutional:      Appearance: He is well-developed.  Eyes:     Pupils: Pupils are equal, round, and reactive to light.  Pulmonary:     Effort: Pulmonary effort is normal.  Skin:    General: Skin is warm and dry.  Neurological:     Mental Status: He is alert and oriented to person, place, and time.  Psychiatric:        Behavior: Behavior normal.    Ortho Exam right knee was not hot red warm or swollen.  Mild medial lateral joint pain grade full extension of flexed over 105 degrees without instability.  No effusion.  Minimal patellar crepitation and excellent strength.  Good pulses.  Neurologically intact  Specialty Comments:  No specialty comments available.  Imaging: XR KNEE 3 VIEW RIGHT  Result Date: 08/15/2020 Films of the right knee were obtained in 3 projections standing and compared to films performed in 2018.  There is very minimal progression of the arthritis in all 3 compartments.  Alignment is neutral.  There are some peripheral osteophytes but the joint spaces are well-maintained.  A little more calcification in the popliteal artery from films in 2018    PMFS History: Patient Active Problem List   Diagnosis Date Noted   Unilateral primary osteoarthritis, right knee 08/15/2020   History of kidney stones 07/10/2019   COPD (chronic obstructive pulmonary disease) ([place]) 07/10/2019   Osteoarthritis 02/02/2011   Nicotine use disorder 02/02/2011   Erectile dysfunction 02/02/2011   Past Medical History:  Diagnosis Date   Arthritis    Kidney stones 2011   had kidney stones spring of the year and then previously 42 yrs     Family History  Problem Relation Age of Onset   Healthy Daughter    Healthy Son    Alzheimer's disease Mother    Cancer Father 72       lung cancer    Past Surgical History:  Procedure Laterality Date    TONSILLECTOMY     Social History   Occupational History   Not on file  Tobacco Use   Smoking status: Every Day    Packs/day: 0.50    Pack years: 0.00    Types: Cigarettes   Smokeless tobacco: Never  Vaping Use   Vaping Use: Never used  Substance and Sexual Activity   Alcohol use: Yes   Drug use: No   Sexual activity: Not on file     Garald Balding, MD   Note - This record has been created using Editor, commissioning.  Chart creation errors have been sought, but may not always  have been located. Such creation errors do not reflect on  the standard of medical care.

## 2020-11-26 ENCOUNTER — Ambulatory Visit: Payer: Self-pay

## 2020-11-26 ENCOUNTER — Other Ambulatory Visit: Payer: Self-pay

## 2020-11-26 ENCOUNTER — Encounter: Payer: Self-pay | Admitting: Orthopaedic Surgery

## 2020-11-26 ENCOUNTER — Ambulatory Visit: Payer: Federal, State, Local not specified - PPO | Admitting: Orthopaedic Surgery

## 2020-11-26 DIAGNOSIS — G8929 Other chronic pain: Secondary | ICD-10-CM | POA: Diagnosis not present

## 2020-11-26 DIAGNOSIS — M545 Low back pain, unspecified: Secondary | ICD-10-CM

## 2020-11-26 DIAGNOSIS — M25551 Pain in right hip: Secondary | ICD-10-CM | POA: Diagnosis not present

## 2020-11-26 NOTE — Progress Notes (Signed)
Office Visit Note   Patient: Maxwell Leonard           Date of Birth: 17-Dec-1945           MRN: [id number] Visit Date: 11/26/2020              Requested by: Maxwell Post, MD [place],  [place] [address] PCP: Maxwell Post, MD   Assessment & Plan: Visit Diagnoses:  1. Pain in right hip   2. Chronic right-sided low back pain without sciatica     Plan: [name] has been experiencing right paralumbar pain to the point of compromise.  He is having difficulty when he plays golf and that one particular area.  He does take an occasional Celebrex and even uses Voltaren gel which seems to help.  He has not had any referred pain to either lower extremity.  Has not had any bowel or bladder changes.  Films demonstrate considerable degenerative disc and facet arthritis throughout the lumbar spine.  There is no evidence of radicular pain.  We will try a course of physical therapy and continue with the Voltaren gel and Celebrex as needed.  He only takes it once or twice a week.  He has had lab in the past without any abnormalities.  If no improvement over the next 3 to 4 weeks would like to see him back and consider an MRI scan  Follow-Up Instructions: Return if symptoms worsen or fail to improve.   Orders:  Orders Placed This Encounter  Procedures   XR Lumbar Spine 2-3 Views   XR Pelvis 1-2 Views   No orders of the defined types were placed in this encounter.     Procedures: No procedures performed   Clinical Data: No additional findings.   Subjective: Chief Complaint  Patient presents with   Right Hip - Pain  Patient presents today for right hip pain. He said that it has been hurting for a month. His pain is located at this right upper buttock area. No groin pain. No pain down either leg, and no numbness or tingling. He plays golf and states that it hurts when he swings backward. He has been taking Celebrex and applying Voltaren gel that helps.  Local heat has  also been effective.  No referred pain to either lower extremity.  No bowel or bladder changes  HPI  Review of Systems   Objective: Vital Signs: There were no vitals taken for this visit.  Physical Exam Constitutional:      Appearance: He is well-developed.  Pulmonary:     Effort: Pulmonary effort is normal.  Skin:    General: Skin is warm and dry.  Neurological:     Mental Status: He is alert and oriented to person, place, and time.  Psychiatric:        Behavior: Behavior normal.    Ortho Exam alert and oriented x3.  Comfortable sitting.  No acute distress.  Straight leg raise negative bilaterally.  Painless range of motion both hips.  Walks without a limp.  There was 1 area of tenderness in the right paralumbar sacral region without mass formation.  Little to forward bend at least touch the tips of his fingers to the mid shin.  Motor exam appears to be intact  Specialty Comments:  No specialty comments available.  Imaging: XR Lumbar Spine 2-3 Views  Result Date: 11/26/2020 Films of the lumbar spine obtained in the AP and lateral projections.  There is  diffuse degenerative disc disease throughout the lumbar spine with an anterior listhesis of L5 on S1.  There is also diffuse calcification of the abdominal aorta without obvious aneurysmal dilatation.  Facet sclerosis also identified at L4-5 and L5-S1.  There are anterior osteophytes at each of the lumbar vertebrae.  XR Pelvis 1-2 Views  Result Date: 11/26/2020 AP the pelvis demonstrates intact hips bilaterally without significant narrowing or irregularity.  No abnormality about either greater trochanter.  Considerable degenerative disc disease at L4-5 and L5 and S1    PMFS History: Patient Active Problem List   Diagnosis Date Noted   Low back pain 11/26/2020   Unilateral primary osteoarthritis, right knee 08/15/2020   History of kidney stones 07/10/2019   COPD (chronic obstructive pulmonary disease) ([facility]) 07/10/2019    Osteoarthritis 02/02/2011   Nicotine use disorder 02/02/2011   Erectile dysfunction 02/02/2011   Past Medical History:  Diagnosis Date   Arthritis    Kidney stones 2011   had kidney stones spring of the year and then previously 31 yrs     Family History  Problem Relation Age of Onset   Healthy Daughter    Healthy Son    Alzheimer's disease Mother    Cancer Father 61       lung cancer    Past Surgical History:  Procedure Laterality Date   TONSILLECTOMY     Social History   Occupational History   Not on file  Tobacco Use   Smoking status: Every Day    Packs/day: 0.50    Types: Cigarettes   Smokeless tobacco: Never  Vaping Use   Vaping Use: Never used  Substance and Sexual Activity   Alcohol use: Yes   Drug use: No   Sexual activity: Not on file

## 2021-04-21 ENCOUNTER — Other Ambulatory Visit: Payer: Self-pay | Admitting: Orthopaedic Surgery

## 2021-09-02 ENCOUNTER — Encounter: Payer: Self-pay | Admitting: Family Medicine

## 2021-09-02 ENCOUNTER — Ambulatory Visit (INDEPENDENT_AMBULATORY_CARE_PROVIDER_SITE_OTHER): Payer: Federal, State, Local not specified - PPO | Admitting: Family Medicine

## 2021-09-02 VITALS — BP 130/66 | HR 56 | Temp 98.0°F | Ht 64.0 in | Wt 160.1 lb

## 2021-09-02 DIAGNOSIS — J441 Chronic obstructive pulmonary disease with (acute) exacerbation: Secondary | ICD-10-CM

## 2021-09-02 DIAGNOSIS — R0989 Other specified symptoms and signs involving the circulatory and respiratory systems: Secondary | ICD-10-CM | POA: Diagnosis not present

## 2021-09-02 LAB — POCT INFLUENZA A/B
Influenza A, POC: NEGATIVE
Influenza B, POC: NEGATIVE

## 2021-09-02 LAB — POC COVID19 BINAXNOW: SARS Coronavirus 2 Ag: NEGATIVE

## 2021-09-02 MED ORDER — PREDNISONE 20 MG PO TABS: ORAL_TABLET | ORAL | 0 refills | Status: DC

## 2021-09-02 MED ORDER — DOXYCYCLINE HYCLATE 100 MG PO CAPS: 100.0000 mg | ORAL_CAPSULE | Freq: Two times a day (BID) | ORAL | 0 refills | Status: DC

## 2021-09-02 MED ORDER — ALBUTEROL SULFATE HFA 108 (90 BASE) MCG/ACT IN AERS
2.0000 | INHALATION_SPRAY | Freq: Four times a day (QID) | RESPIRATORY_TRACT | 2 refills | Status: DC | PRN
Start: 2021-09-02 — End: 2022-12-21

## 2021-12-04 ENCOUNTER — Ambulatory Visit (INDEPENDENT_AMBULATORY_CARE_PROVIDER_SITE_OTHER): Payer: Federal, State, Local not specified - PPO | Admitting: *Deleted

## 2021-12-04 DIAGNOSIS — Z23 Encounter for immunization: Secondary | ICD-10-CM

## 2021-12-17 ENCOUNTER — Ambulatory Visit: Payer: Federal, State, Local not specified - PPO | Admitting: Orthopaedic Surgery

## 2021-12-17 ENCOUNTER — Encounter: Payer: Self-pay | Admitting: Orthopaedic Surgery

## 2021-12-17 ENCOUNTER — Ambulatory Visit (INDEPENDENT_AMBULATORY_CARE_PROVIDER_SITE_OTHER): Payer: Federal, State, Local not specified - PPO

## 2021-12-17 DIAGNOSIS — M79652 Pain in left thigh: Secondary | ICD-10-CM | POA: Diagnosis not present

## 2021-12-17 DIAGNOSIS — M545 Low back pain, unspecified: Secondary | ICD-10-CM | POA: Diagnosis not present

## 2021-12-17 DIAGNOSIS — M79605 Pain in left leg: Secondary | ICD-10-CM

## 2021-12-17 DIAGNOSIS — M5416 Radiculopathy, lumbar region: Secondary | ICD-10-CM

## 2021-12-17 DIAGNOSIS — G8929 Other chronic pain: Secondary | ICD-10-CM

## 2021-12-17 NOTE — Progress Notes (Signed)
Office Visit Note   Patient: Maxwell Leonard           Date of Birth: [birth year]           MRN: [id number] Visit Date: 12/17/2021              Requested by: Maxwell Post, MD [place],  [place] [id number] PCP: Maxwell Post, MD   Assessment & Plan: Visit Diagnoses:  1. Pain in left leg   2. Radiculopathy, lumbar region   3. Chronic bilateral low back pain without sciatica   4. Left thigh pain     Plan: Maxwell Leonard visited the office specifically for evaluation of left thigh pain.  He has been experiencing pain for several months without history of injury or trauma.  The pain is "on and off".  It is easily treated with ibuprofen.  He denies any groin pain he has not had any numbness or tingling in the having any specific back pain.  X-rays of his hip and knee were essentially negative without any significant arthritis.  He does have considerable degenerative change in his lumbar spine and could easily have stenosis.  He has good pulses so I do not think this is vascular claudication.  I will order an MRI scan.  In the meantime he will take over-the-counter Advil or Aleve  Follow-Up Instructions: Return After MRI scan lumbar spine.   Orders:  Orders Placed This Encounter  Procedures   XR Pelvis 1-2 Views   XR FEMUR MIN 2 VIEWS LEFT   MR Lumbar Spine w/o contrast   No orders of the defined types were placed in this encounter.     Procedures: No procedures performed   Clinical Data: No additional findings.   Subjective: Chief Complaint  Patient presents with   Left Leg - Pain  At least 1 month history of insidious onset mid left thigh pain without injury or trauma.  There is no groin or knee pain.  He has had history of back problems but does not feel like his present problem is referred from his back.  He denies any numbness or tingling.  Not having any issues with his right lower extremity  HPI  Review of Systems   Objective: Vital Signs:  There were no vitals taken for this visit.  Physical Exam Constitutional:      Appearance: He is well-developed.  Eyes:     Pupils: Pupils are equal, round, and reactive to light.  Pulmonary:     Effort: Pulmonary effort is normal.  Skin:    General: Skin is warm and dry.  Neurological:     Mental Status: He is alert and oriented to person, place, and time.  Psychiatric:        Behavior: Behavior normal.     Ortho Exam awake alert and oriented x3.  Comfortable sitting.  No pain today.  No thigh pain.  Painless range of motion of the left hip with internal/external rotation and no loss of motion.  No left knee pain.  There is no effusion increased heat or warmth.  No calf pain.  Neurologically intact.  No percussible lumbar spine.  Straight leg raise negative.  He actually had good pulses in both dorsalis pedis and posterior tibial region left foot  Specialty Comments:  No specialty comments available.  Imaging: XR Pelvis 1-2 Views  Result Date: 12/17/2021 AP pelvis did not demonstrate significant arthritic changes in either hip..  Joint spaces are well-maintained.  No acute changes.  Considerable degenerative change on the limited AP view of L4-5 and L5-S1  XR FEMUR MIN 2 VIEWS LEFT  Result Date: 12/17/2021 Films of the left femur were obtained in several projections.  There is diffuse calcification of the femoral artery.  No bony changes.  Minimal degenerative change of the left hip limited views of the left knee did not demonstrate significant arthritis    PMFS History: Patient Active Problem List   Diagnosis Date Noted   Left thigh pain 12/17/2021   Low back pain 11/26/2020   Unilateral primary osteoarthritis, right knee 08/15/2020   History of kidney stones 07/10/2019   COPD (chronic obstructive pulmonary disease) ([place]) 07/10/2019   Osteoarthritis 02/02/2011   Nicotine use disorder 02/02/2011   Erectile dysfunction 02/02/2011   Past Medical History:  Diagnosis Date    Arthritis    Kidney stones 2011   had kidney stones spring of the year and then previously 68 yrs     Family History  Problem Relation Age of Onset   Healthy Daughter    Healthy Son    Alzheimer's disease Mother    Cancer Father 12       lung cancer    Past Surgical History:  Procedure Laterality Date   TONSILLECTOMY     Social History   Occupational History   Not on file  Tobacco Use   Smoking status: Every Day    Packs/day: 0.50    Types: Cigarettes   Smokeless tobacco: Never  Vaping Use   Vaping Use: Never used  Substance and Sexual Activity   Alcohol use: Yes   Drug use: No   Sexual activity: Not on file     Maxwell Balding, MD   Note - This record has been created using Editor, commissioning.  Chart creation errors have been sought, but may not always  have been located. Such creation errors do not reflect on  the standard of medical care.

## 2021-12-28 ENCOUNTER — Other Ambulatory Visit: Payer: Self-pay | Admitting: Physician Assistant

## 2021-12-30 ENCOUNTER — Ambulatory Visit
Admission: RE | Admit: 2021-12-30 | Discharge: 2021-12-30 | Disposition: A | Discharge status: Home / Self Care | Payer: Federal, State, Local not specified - PPO | Source: Ambulatory Visit | Attending: Physician Assistant | Admitting: Physician Assistant

## 2021-12-30 DIAGNOSIS — M79605 Pain in left leg: Secondary | ICD-10-CM

## 2021-12-30 DIAGNOSIS — M5416 Radiculopathy, lumbar region: Secondary | ICD-10-CM

## 2022-01-07 ENCOUNTER — Encounter: Payer: Self-pay | Admitting: Orthopaedic Surgery

## 2022-01-07 ENCOUNTER — Ambulatory Visit: Payer: Federal, State, Local not specified - PPO | Admitting: Orthopaedic Surgery

## 2022-01-07 DIAGNOSIS — G8929 Other chronic pain: Secondary | ICD-10-CM | POA: Diagnosis not present

## 2022-01-07 DIAGNOSIS — M545 Low back pain, unspecified: Secondary | ICD-10-CM

## 2022-01-07 NOTE — Progress Notes (Signed)
Office Visit Note   Patient: Maxwell Leonard           Date of Birth: 29-Dec-1945           MRN: [id number] Visit Date: 01/07/2022              Requested by: Eulas Post, MD [place],  [place] [id number] PCP: Eulas Post, MD   Assessment & Plan: Visit Diagnoses:  1. Chronic bilateral low back pain without sciatica     Plan: Nicholus has been experiencing low back pain associated with occasional sensation of pain in the left thigh.  He also is experiencing some claudication but tickly when he walks a long distance.  His plain films of the lumbar spine demonstrated degenerative changes.  Because of his chronic history I ordered an MRI scan.  The study demonstrates considerable degenerative change from L2-3 through L5-S1 with areas of neuroforaminal and central stenosis.  L5-S1 there was moderate spinal stenosis at the level of the S1 endplate and moderate bilateral neuroforaminal narrowing which could impinge in the L5 nerve roots.  The there are more changes at this level than at any other is although there was severe narrowing of the subarticular zones and moderate stenosis at L3-4.  He notes that every so often he will experience the pain in his legs but it is not something that happens on a daily basis.  I think based on the above it is worth trying a course of physical therapy.  He agrees.  Secondarily we can consider an epidural steroid injection.  Discussed the all the above with him and the results of the MRI scan using the spine model.  He did have some renal cyst but the radiologist suggested that he did not need any further follow-up as they were consistent with benign lesions  Follow-Up Instructions: Return if symptoms worsen or fail to improve.   Orders:  Orders Placed This Encounter  Procedures   Ambulatory referral to Physical Therapy   No orders of the defined types were placed in this encounter.     Procedures: No procedures  performed   Clinical Data: No additional findings.   Subjective: Chief Complaint  Patient presents with   Lower Back - Pain  Returns for further evaluation of his low back pain associated with occasional discomfort in his left lower extremity.  He notes that he will take an Advil or Aleve with relief of the pain and he only has the discomfort maybe every 2 or 3 weeks.  Had an MRI scan of his lumbar spine here for the results no change in bowel or bladder function  HPI  Review of Systems   Objective: Vital Signs: There were no vitals taken for this visit.  Physical Exam Constitutional:      Appearance: He is well-developed.  Eyes:     Pupils: Pupils are equal, round, and reactive to light.  Pulmonary:     Effort: Pulmonary effort is normal.  Skin:    General: Skin is warm and dry.  Neurological:     Mental Status: He is alert and oriented to person, place, and time.  Psychiatric:        Behavior: Behavior normal.     Ortho Exam awake alert and oriented x3.  Comfortable sitting.  Straight leg raise negative.  Able to get up and down easily from a low sitting position.  Neurologically intact.  Painless range of motion both hips.  No  significant tenderness to percussion of the lumbar spine  Specialty Comments:  No specialty comments available.  Imaging: No results found.   PMFS History: Patient Active Problem List   Diagnosis Date Noted   Left thigh pain 12/17/2021   Low back pain 11/26/2020   Unilateral primary osteoarthritis, right knee 08/15/2020   History of kidney stones 07/10/2019   COPD (chronic obstructive pulmonary disease) ([place]) 07/10/2019   Osteoarthritis 02/02/2011   Nicotine use disorder 02/02/2011   Erectile dysfunction 02/02/2011   Past Medical History:  Diagnosis Date   Arthritis    Kidney stones 2011   had kidney stones spring of the year and then previously 66 yrs     Family History  Problem Relation Age of Onset   Healthy Daughter     Healthy Son    Alzheimer's disease Mother    Cancer Father 20       lung cancer    Past Surgical History:  Procedure Laterality Date   TONSILLECTOMY     Social History   Occupational History   Not on file  Tobacco Use   Smoking status: Every Day    Packs/day: 0.50    Types: Cigarettes   Smokeless tobacco: Never  Vaping Use   Vaping Use: Never used  Substance and Sexual Activity   Alcohol use: Yes   Drug use: No   Sexual activity: Not on file     Garald Balding, MD   Note - This record has been created using Editor, commissioning.  Chart creation errors have been sought, but may not always  have been located. Such creation errors do not reflect on  the standard of medical care.

## 2022-02-09 ENCOUNTER — Ambulatory Visit: Payer: Federal, State, Local not specified - PPO | Admitting: Family Medicine

## 2022-02-09 ENCOUNTER — Encounter: Payer: Self-pay | Admitting: Family Medicine

## 2022-02-09 VITALS — BP 124/60 | HR 67 | Temp 98.3°F | Ht 64.0 in | Wt 157.3 lb

## 2022-02-09 DIAGNOSIS — J441 Chronic obstructive pulmonary disease with (acute) exacerbation: Secondary | ICD-10-CM | POA: Diagnosis not present

## 2022-02-09 DIAGNOSIS — J029 Acute pharyngitis, unspecified: Secondary | ICD-10-CM | POA: Diagnosis not present

## 2022-02-09 LAB — POCT INFLUENZA A/B
Influenza A, POC: NEGATIVE
Influenza B, POC: NEGATIVE

## 2022-02-09 LAB — POC COVID19 BINAXNOW: SARS Coronavirus 2 Ag: NEGATIVE

## 2022-02-09 LAB — POCT RAPID STREP A (OFFICE): Rapid Strep A Screen: NEGATIVE

## 2022-02-09 MED ORDER — DOXYCYCLINE HYCLATE 100 MG PO CAPS: 100.0000 mg | ORAL_CAPSULE | Freq: Two times a day (BID) | ORAL | 0 refills | Status: DC

## 2022-02-09 MED ORDER — PREDNISONE 20 MG PO TABS: ORAL_TABLET | ORAL | 0 refills | Status: DC

## 2022-02-09 NOTE — Progress Notes (Signed)
Established Patient Office Visit  Subjective   Patient ID: Maxwell Leonard, male    DOB: 1945-09-19  Age: 76 y.o. MRN: [id number]  Chief Complaint  Patient presents with   Cough    Productive cough, x3 days    Nasal Congestion    Patient complains of nasal congestion, x2 days, Tried Mucinex with little relief     HPI   Maxwell Leonard is seen with some productive cough for the past several days.  Onset last Saturday.  He has longstanding history of smoking and COPD.  Denies any fever.  Has been taking over-the-counter Mucinex 1200 mg twice daily.  He has benefited from combination therapy with prednisone and antibiotics in the past for COPD flareups.  He denies any wheezing, appetite or weight changes, hemoptysis, or fever.  No chest pains.  Denies any chronic sinusitis symptoms.  Past Medical History:  Diagnosis Date   Arthritis    Kidney stones 2011   had kidney stones spring of the year and then previously 19 yrs    Past Surgical History:  Procedure Laterality Date   TONSILLECTOMY      reports that he has been smoking cigarettes. He has been smoking an average of .5 packs per day. He has never used smokeless tobacco. He reports current alcohol use. He reports that he does not use drugs. family history includes Alzheimer's disease in his mother; Cancer (age of onset: 54) in his father; Healthy in his daughter and son. No Known Allergies  Review of Systems  Constitutional:  Negative for chills and fever.  HENT:  Negative for sore throat.   Respiratory:  Positive for cough and sputum production. Negative for hemoptysis.   Cardiovascular:  Negative for chest pain.      Objective:     BP 124/60 (BP Location: Left Arm, Patient Position: Sitting, Cuff Size: Normal)   Pulse 67   Temp 98.3 F (36.8 C) (Oral)   Ht '5\' 4"'$  (1.626 m)   Wt 157 lb 4.8 oz (71.4 kg)   SpO2 96%   BMI 27.00 kg/m  BP Readings from Last 3 Encounters:  02/09/22 124/60  09/02/21 130/66  06/17/20  130/70   Wt Readings from Last 3 Encounters:  02/09/22 157 lb 4.8 oz (71.4 kg)  09/02/21 160 lb 1.6 oz (72.6 kg)  06/17/20 158 lb 6.4 oz (71.8 kg)      Physical Exam Vitals reviewed.  Constitutional:      General: He is not in acute distress.    Appearance: Normal appearance. He is not ill-appearing.  HENT:     Right Ear: Tympanic membrane normal.     Left Ear: Tympanic membrane normal.  Cardiovascular:     Rate and Rhythm: Normal rate and regular rhythm.  Pulmonary:     Effort: Pulmonary effort is normal.     Breath sounds: No rales.     Comments: Slightly diminished breath sounds throughout. Musculoskeletal:     Cervical back: Neck supple.  Lymphadenopathy:     Cervical: No cervical adenopathy.  Neurological:     Mental Status: He is alert.      No results found for any visits on 02/09/22.    The ASCVD Risk score (Arnett DK, et al., 2019) failed to calculate for the following reasons:   Cannot find a previous HDL lab   Cannot find a previous total cholesterol lab    Assessment & Plan:   Acute exacerbation of COPD.  Patient in no respiratory distress.  O2 sat 96% room air.  No reported fever.  -Continue albuterol MDI as needed -Prednisone 20 mg take 2 tablets once daily for 5 days -Doxycycline 100 mg twice daily for 7 days -Follow-up immediately for any fever or increased shortness of breath -We previously discussed consideration for some baseline PFTs and consideration for more chronic COPD therapy but he has declined. -Patient had testing today including influenza and COVID which were negative  No follow-ups on file.    Carolann Littler, MD

## 2022-03-06 ENCOUNTER — Ambulatory Visit: Payer: Federal, State, Local not specified - PPO | Admitting: Family Medicine

## 2022-03-06 VITALS — BP 118/64 | HR 78 | Temp 97.6°F | Wt 160.2 lb

## 2022-03-06 DIAGNOSIS — J209 Acute bronchitis, unspecified: Secondary | ICD-10-CM

## 2022-03-06 DIAGNOSIS — R059 Cough, unspecified: Secondary | ICD-10-CM | POA: Diagnosis not present

## 2022-03-06 DIAGNOSIS — R0981 Nasal congestion: Secondary | ICD-10-CM

## 2022-03-06 DIAGNOSIS — J440 Chronic obstructive pulmonary disease with acute lower respiratory infection: Secondary | ICD-10-CM

## 2022-03-06 LAB — POCT INFLUENZA A/B
Influenza A, POC: NEGATIVE
Influenza B, POC: NEGATIVE

## 2022-03-06 LAB — POC COVID19 BINAXNOW: SARS Coronavirus 2 Ag: NEGATIVE

## 2022-03-06 MED ORDER — AMOXICILLIN-POT CLAVULANATE 875-125 MG PO TABS: 1.0000 | ORAL_TABLET | Freq: Two times a day (BID) | ORAL | 0 refills | Status: DC

## 2022-03-06 MED ORDER — PREDNISONE 20 MG PO TABS: 20.0000 mg | ORAL_TABLET | Freq: Two times a day (BID) | ORAL | 0 refills | Status: DC

## 2022-03-06 NOTE — Progress Notes (Signed)
   Subjective:    Patient ID: Maxwell Leonard, male    DOB: 1945-10-27, 76 y.o.   MRN: [id number]  HPI Here for 5 days of chest congestion, wheezing, SOB, and a dry cough. No fever. Using his inhaler and Mucinex.    Review of Systems  Constitutional: Negative.   HENT: Negative.    Eyes: Negative.   Respiratory:  Positive for cough, chest tightness, shortness of breath and wheezing.   Cardiovascular: Negative.        Objective:   Physical Exam Constitutional:      Appearance: Normal appearance.  HENT:     Right Ear: Tympanic membrane, ear canal and external ear normal.     Left Ear: Tympanic membrane, ear canal and external ear normal.     Nose: Nose normal.     Mouth/Throat:     Pharynx: Oropharynx is clear.  Eyes:     Conjunctiva/sclera: Conjunctivae normal.  Pulmonary:     Effort: Pulmonary effort is normal.     Breath sounds: Wheezing present. No rhonchi or rales.  Lymphadenopathy:     Cervical: No cervical adenopathy.  Neurological:     Mental Status: He is alert.           Assessment & Plan:  Bronchitis, treat with 10 days of Augmentin and Prednisone 20 mg BID. Alysia Penna, MD

## 2022-12-21 ENCOUNTER — Encounter: Payer: Self-pay | Admitting: Family Medicine

## 2022-12-21 ENCOUNTER — Ambulatory Visit: Payer: Federal, State, Local not specified - PPO | Admitting: Family Medicine

## 2022-12-21 VITALS — BP 120/70 | HR 62 | Temp 98.2°F | Resp 16 | Ht 64.0 in | Wt 152.5 lb

## 2022-12-21 DIAGNOSIS — J069 Acute upper respiratory infection, unspecified: Secondary | ICD-10-CM

## 2022-12-21 DIAGNOSIS — R059 Cough, unspecified: Secondary | ICD-10-CM | POA: Diagnosis not present

## 2022-12-21 DIAGNOSIS — J441 Chronic obstructive pulmonary disease with (acute) exacerbation: Secondary | ICD-10-CM | POA: Diagnosis not present

## 2022-12-21 LAB — POC COVID19 BINAXNOW: SARS Coronavirus 2 Ag: NEGATIVE

## 2022-12-21 MED ORDER — PREDNISONE 20 MG PO TABS: 40.0000 mg | ORAL_TABLET | Freq: Every day | ORAL | 0 refills | Status: AC

## 2022-12-21 MED ORDER — ALBUTEROL SULFATE HFA 108 (90 BASE) MCG/ACT IN AERS: 2.0000 | INHALATION_SPRAY | Freq: Four times a day (QID) | RESPIRATORY_TRACT | 2 refills | Status: DC | PRN

## 2022-12-21 MED ORDER — DOXYCYCLINE HYCLATE 100 MG PO TABS
100.0000 mg | ORAL_TABLET | Freq: Two times a day (BID) | ORAL | 0 refills | Status: DC
Start: 2022-12-21 — End: 2022-12-28

## 2022-12-21 NOTE — Progress Notes (Signed)
ACUTE VISIT Chief Complaint  Patient presents with   Cough    X 5-6 days, productive.    HPI: [name] is a 77 y.o. male with a PMHx significant for COPD, OA, erectile dysfunction, and back pain, who is here today complaining of cough for 5-6 days.  He is complaining of a productive cough for the last 5-6 days. He says the sputum is yellowish and has not observed blood in the sputum.  He endorses associated nasal congestion and rhinorrhea.   Cough This is a new problem. The current episode started in the past 7 days. The problem has been unchanged. The problem occurs every few hours. The cough is Productive of sputum. Associated symptoms include nasal congestion, postnasal drip and rhinorrhea. Pertinent negatives include no chest pain, chills, ear pain, eye redness, fever, headaches, heartburn, hemoptysis, myalgias, sore throat, shortness of breath, sweats, weight loss or wheezing. Nothing aggravates the symptoms. Risk factors for lung disease include smoking/tobacco exposure. He has tried OTC cough suppressant for the symptoms. The treatment provided mild relief. His past medical history is significant for COPD.   He denies any fever, chills, body ache's, wheezing, SOB, chest pain, sore throat, or recent sick contacts.  He has been taking mucinex for his cough.   States that 02/2022 he was Dx'ed with bronchitis and abx helped with symptoms.  COPD: He states he no longer has an Albuterol inhaler.  + Smoker.  Review of Systems  Constitutional:  Positive for fatigue. Negative for appetite change, chills, fever and weight loss.  HENT:  Positive for postnasal drip and rhinorrhea. Negative for ear pain and sore throat.   Eyes:  Negative for discharge and redness.  Respiratory:  Positive for cough. Negative for hemoptysis, shortness of breath and wheezing.   Cardiovascular:  Negative for chest pain.  Gastrointestinal:  Negative for abdominal pain, heartburn, nausea and vomiting.   Genitourinary:  Negative for decreased urine volume, dysuria and hematuria.  Musculoskeletal:  Negative for myalgias.  Neurological:  Negative for syncope, weakness and headaches.  See other pertinent positives and negatives in HPI.  Current Outpatient Medications on File Prior to Visit  Medication Sig Dispense Refill   aspirin 81 MG tablet Take 81 mg by mouth daily. Reported on 07/02/2015     augmented betamethasone dipropionate (DIPROLENE-AF) 0.05 % ointment Apply topically 2 (two) times daily.     celecoxib (CELEBREX) 200 MG capsule TAKE 1 CAPSULE BY MOUTH EVERY DAY 30 capsule 2   diclofenac Sodium (VOLTAREN) 1 % GEL APPLY 2-4 G TOPICALLY 4 (FOUR) TIMES DAILY AS NEEDED. 500 g 1   tamsulosin (FLOMAX) 0.4 MG CAPS capsule TAKE 1 CAPSULE BY MOUTH EVERY DAY 30 capsule 0   No current facility-administered medications on file prior to visit.   Past Medical History:  Diagnosis Date   Arthritis    Kidney stones 2011   had kidney stones spring of the year and then previously 20 yrs    No Known Allergies  Social History   Socioeconomic History   Marital status: Married    Spouse name: Not on file   Number of children: Not on file   Years of education: Not on file   Highest education level: Professional school degree (e.g., MD, DDS, DVM, JD)  Occupational History   Not on file  Tobacco Use   Smoking status: Every Day    Current packs/day: 0.50    Types: Cigarettes   Smokeless tobacco: Never  Vaping Use  Vaping status: Never Used  Substance and Sexual Activity   Alcohol use: Yes   Drug use: No   Sexual activity: Not on file  Other Topics Concern   Not on file  Social History Narrative   Not on file   Social Determinants of Health   Financial Resource Strain: Low Risk  (03/06/2022)   Overall Financial Resource Strain (CARDIA)    Difficulty of Paying Living Expenses: Not hard at all  Food Insecurity: No Food Insecurity (03/06/2022)   Hunger Vital Sign    Worried About  Running Out of Food in the Last Year: Never true    Ran Out of Food in the Last Year: Never true  Transportation Needs: No Transportation Needs (03/06/2022)   PRAPARE - Administrator, Civil Service (Medical): No    Lack of Transportation (Non-Medical): No  Physical Activity: Insufficiently Active (03/06/2022)   Exercise Vital Sign    Days of Exercise per Week: 3 days    Minutes of Exercise per Session: 20 min  Stress: No Stress Concern Present (03/06/2022)   [employer] of Occupational Health - Occupational Stress Questionnaire    Feeling of Stress : Only a little  Social Connections: Moderately Integrated (03/06/2022)   Social Connection and Isolation Panel [NHANES]    Frequency of Communication with Friends and Family: More than three times a week    Frequency of Social Gatherings with Friends and Family: Once a week    Attends Religious Services: 1 to 4 times per year    Active Member of [employer] or Organizations: No    Attends Banker Meetings: Not on file    Marital Status: Married   Vitals:   12/21/22 1143  BP: 120/70  Pulse: 62  Resp: 16  Temp: 98.2 F (36.8 C)  SpO2: 94%   Body mass index is 26.18 kg/m.  Physical Exam Vitals and nursing note reviewed.  Constitutional:      General: He is not in acute distress.    Appearance: He is well-developed.  HENT:     Head: Normocephalic and atraumatic.     Nose: Nose normal.     Mouth/Throat:     Mouth: Mucous membranes are moist.     Pharynx: No posterior oropharyngeal erythema.  Eyes:     Conjunctiva/sclera: Conjunctivae normal.  Cardiovascular:     Rate and Rhythm: Normal rate and regular rhythm.     Heart sounds: No murmur heard. Pulmonary:     Effort: Pulmonary effort is normal. No respiratory distress.     Breath sounds: Wheezing present. No rhonchi or rales.  Lymphadenopathy:     Cervical: No cervical adenopathy.  Skin:    General: Skin is warm.     Findings: No erythema or  rash.  Neurological:     Mental Status: He is alert and oriented to person, place, and time.     Cranial Nerves: No cranial nerve deficit.     Gait: Gait normal.  Psychiatric:        Mood and Affect: Mood and affect normal.   ASSESSMENT AND PLAN:  Maxwell Leonard was seen today for cough.   URI, acute Symptoms suggests a viral etiology, I explained patient that symptomatic treatment is usually recommended in this case, so I do not think abx is needed at this time. Rx for Doxycycline given in case symptoms are not any better in a week but I do not think he needs it at this time. Some side  effects discussed. Instructed to monitor for signs of complications, including new onset of fever among some, clearly instructed about warning signs. Continue plain Mucinex. F/U as needed.  Acute exacerbation of chronic obstructive pulmonary disease (COPD) (HCC) Mild. Albuterol inh 2 puff every 6 hours for a week then as needed for wheezing or shortness of breath.  Prednisone to take with breakfast for 3-5 days. I do not think imaging is needed at this time. Instructed about warning signs. Encouraged smoking cessation.  -     predniSONE; Take 2 tablets (40 mg total) by mouth daily with breakfast for 5 days.  Dispense: 10 tablet; Refill: 0 -     Doxycycline Hyclate; Take 1 tablet (100 mg total) by mouth 2 (two) times daily for 7 days.  Dispense: 14 tablet; Refill: 0  -     Albuterol Sulfate HFA; Inhale 2 puffs into the lungs every 6 (six) hours as needed for wheezing or shortness of breath.  Dispense: 1 each; Refill: 2  Cough, unspecified type COVID 19 test negative. Explained that cough and nasal congestion can last a few days and sometimes weeks after acute symptoms improved.  -     POC COVID-19 BinaxNow  Return in about 2 weeks (around 01/04/2023), or if symptoms worsen or fail to improve.  I, Rolla Etienne Wierda, acting as a scribe for Sami Roes [place], MD., have documented all relevant documentation  on the behalf of Makaelyn Aponte [place], MD, as directed by  Ravi Tuccillo [place], MD while in the presence of Marina Boerner [place], MD.   I, Nalleli Largent [place], MD, have reviewed all documentation for this visit. The documentation on 12/22/22 for the exam, diagnosis, procedures, and orders are all accurate and complete.  Nalee Lightle G. [place], MD  [facility]. [place] office.

## 2022-12-21 NOTE — Patient Instructions (Addendum)
A few things to remember from today's visit:  URI, acute  Cough, unspecified type - Plan: POC COVID-19  Acute exacerbation of chronic obstructive pulmonary disease (COPD) (HCC) - Plan: predniSONE (DELTASONE) 20 MG tablet, doxycycline (VIBRA-TABS) 100 MG tablet  It seems like a viral illness. I do not think you need an antibiotic now but you can started if not feeling any better in 3 days. Prednisone with breakfast for 3-5 days. Albuterol inh 2 puff every 6 hours for a week then as needed for wheezing or shortness of breath.  Continue plain Mucinex.  Do not use My Chart to request refills or for acute issues that need immediate attention. If you send a my chart message, it may take a few days to be addressed, specially if I am not in the office.  Please be sure medication list is accurate. If a new problem present, please set up appointment sooner than planned today.

## 2022-12-28 ENCOUNTER — Ambulatory Visit: Payer: Federal, State, Local not specified - PPO | Admitting: Family Medicine

## 2022-12-28 ENCOUNTER — Ambulatory Visit (INDEPENDENT_AMBULATORY_CARE_PROVIDER_SITE_OTHER)
Admission: RE | Admit: 2022-12-28 | Discharge: 2022-12-28 | Disposition: A | Discharge status: Home / Self Care | Payer: Federal, State, Local not specified - PPO | Source: Ambulatory Visit | Attending: Family Medicine | Admitting: Family Medicine

## 2022-12-28 ENCOUNTER — Encounter: Payer: Self-pay | Admitting: Family Medicine

## 2022-12-28 VITALS — BP 150/64 | HR 75 | Temp 97.5°F | Ht 64.0 in | Wt 155.1 lb

## 2022-12-28 DIAGNOSIS — R0689 Other abnormalities of breathing: Secondary | ICD-10-CM

## 2022-12-28 DIAGNOSIS — R059 Cough, unspecified: Secondary | ICD-10-CM | POA: Diagnosis not present

## 2022-12-28 DIAGNOSIS — J441 Chronic obstructive pulmonary disease with (acute) exacerbation: Secondary | ICD-10-CM

## 2022-12-28 MED ORDER — PREDNISONE 20 MG PO TABS: 20.0000 mg | ORAL_TABLET | Freq: Every day | ORAL | 0 refills | Status: DC

## 2022-12-28 NOTE — Progress Notes (Signed)
Established Patient Office Visit  Subjective   Patient ID: Maxwell Leonard, male    DOB: [birth year]  Age: 77 y.o. MRN: [id number]  Chief Complaint  Patient presents with   Cough    Patient complains of cough, Productive, Tried Prednisone and Doxycycline     HPI   Maxwell Leonard is seen for evaluation regarding recent COPD exacerbation.  Refer to prior note from a week ago for details.  Now about 2 weeks of cough.  He was given prednisone for 5 days and felt this did help somewhat.  Using albuterol inhaler as needed.  Cough mostly dry at this point but sometimes productive.  He did start doxycycline but only took for couple days and then discontinued saying this "did not help ".  Denies any appetite changes, weight changes, dyspnea on exertion, hemoptysis, night sweats, or fever.  Recent COVID test negative.  Still feels like he may have a little wheezing at times.  No recent known sick contacts.  He and his wife went on a cruise to [place] back in August and he was very pleased with that trip.  Denies any recent chest pains.  Does not use any chronic inhalers.  Past Medical History:  Diagnosis Date   Arthritis    Kidney stones 2011   had kidney stones spring of the year and then previously 20 yrs    Past Surgical History:  Procedure Laterality Date   TONSILLECTOMY      reports that he has been smoking cigarettes. He has never used smokeless tobacco. He reports current alcohol use. He reports that he does not use drugs. family history includes Alzheimer's disease in his mother; Cancer (age of onset: 66) in his father; Healthy in his daughter and son. No Known Allergies  Review of Systems  Constitutional:  Negative for chills, fever, malaise/fatigue and weight loss.  Respiratory:  Positive for cough and wheezing. Negative for hemoptysis, sputum production and shortness of breath.   Cardiovascular:  Negative for chest pain and leg swelling.  Gastrointestinal:  Negative for abdominal  pain.      Objective:     BP (!) 150/64 (BP Location: Left Arm, Patient Position: Sitting, Cuff Size: Normal)   Pulse 75   Temp (!) 97.5 F (36.4 C) (Oral)   Ht 5\' 4"  (1.626 m)   Wt 155 lb 1.6 oz (70.4 kg)   SpO2 95%   BMI 26.62 kg/m  BP Readings from Last 3 Encounters:  12/28/22 (!) 150/64  12/21/22 120/70  03/06/22 118/64   Wt Readings from Last 3 Encounters:  12/28/22 155 lb 1.6 oz (70.4 kg)  12/21/22 152 lb 8 oz (69.2 kg)  03/06/22 160 lb 3.2 oz (72.7 kg)      Physical Exam Vitals reviewed.  Constitutional:      General: He is not in acute distress.    Appearance: Normal appearance. He is not ill-appearing.  Cardiovascular:     Rate and Rhythm: Normal rate and regular rhythm.  Pulmonary:     Comments: Lung exam reveals diminished breath sounds right base compared to the left.  No rales.  No wheezes.  No retractions.  Pulse oximetry 95% room air. Musculoskeletal:     Right lower leg: No edema.     Left lower leg: No edema.  Neurological:     Mental Status: He is alert.      No results found for any visits on 12/28/22.  Last CBC Lab Results  Component Value Date  WBC 16.0 (H) 03/15/2019   HGB 13.5 03/15/2019   HCT 39.7 03/15/2019   MCV 93.2 03/15/2019   MCH 31.7 03/15/2019   RDW 12.8 03/15/2019   PLT 275 03/15/2019   Last metabolic panel Lab Results  Component Value Date   GLUCOSE 97 03/15/2019   NA 137 03/15/2019   K 4.5 03/15/2019   CL 99 03/15/2019   CO2 28 03/15/2019   BUN 21 03/15/2019   CREATININE 1.79 (H) 03/15/2019   GFRNONAA 37 (L) 03/15/2019   CALCIUM 9.1 03/15/2019   PROT 6.3 03/14/2019   ALBUMIN 4.3 03/14/2019   BILITOT 0.8 03/14/2019   ALKPHOS 49 03/14/2019   AST 25 03/14/2019   ALT 21 03/14/2019   ANIONGAP 10 03/15/2019      The ASCVD Risk score (Arnett DK, et al., 2019) failed to calculate for the following reasons:   Cannot find a previous HDL lab   Cannot find a previous total cholesterol lab    Assessment &  Plan:   Recent acute exacerbation of COPD.  Patient is in no respiratory distress.  Does have diminished breath sounds right base compared to the left of uncertain significance.  No rales auscultated.  No fever.  Pulse oximetry 95% room air.  -Patient requesting refill of prednisone.  We agreed to refill once. -Set up PA and lateral chest x-ray to further evaluate diminished breath sounds right base. -He has a longstanding history of smoking.  We discussed possible use of daily inhalers but he declines at this time.  He states he generally does not have any significant dyspnea with mild exertion.  No chronic cough.   Return in about 1 month (around 01/28/2023).    Evelena Peat, MD

## 2022-12-28 NOTE — Patient Instructions (Signed)
Go for X-ray today at [address] [facility].

## 2023-01-06 ENCOUNTER — Encounter: Payer: Self-pay | Admitting: Family Medicine

## 2023-01-06 ENCOUNTER — Ambulatory Visit: Payer: Federal, State, Local not specified - PPO | Admitting: Family Medicine

## 2023-01-06 VITALS — BP 126/76 | HR 53 | Temp 98.0°F | Ht 64.0 in | Wt 154.3 lb

## 2023-01-06 DIAGNOSIS — J441 Chronic obstructive pulmonary disease with (acute) exacerbation: Secondary | ICD-10-CM

## 2023-01-06 MED ORDER — PREDNISONE 20 MG PO TABS: ORAL_TABLET | ORAL | 0 refills | Status: DC

## 2023-01-06 MED ORDER — BUDESONIDE-FORMOTEROL FUMARATE 160-4.5 MCG/ACT IN AERO: 2.0000 | INHALATION_SPRAY | Freq: Two times a day (BID) | RESPIRATORY_TRACT | 11 refills | Status: AC

## 2023-01-06 MED ORDER — CEFDINIR 300 MG PO CAPS: 300.0000 mg | ORAL_CAPSULE | Freq: Two times a day (BID) | ORAL | 0 refills | Status: DC

## 2023-01-06 NOTE — Progress Notes (Signed)
Established Patient Office Visit  Subjective   Patient ID: Maxwell Leonard, male    DOB: 1945-08-22  Age: 77 y.o. MRN: [id number]  Chief Complaint  Patient presents with   Cough    Patient complains of cough, Productive    HPI   Maxwell Leonard is seen with persistent productive coughing.  Refer to prior notes for details.  We obtained x-ray last visit which is yet to be over read.  No obvious infiltrate.  He denies any fever.  Still has some productive cough.  Occasionally yellow or green tinge.  No hemoptysis.  Appetite and weight stable.  No dyspnea at rest.  Longstanding history of smoking.  Over 40-pack-year history.  No history of lung function testing.  Usually has about 1 flareup of respiratory illness per year.  Has received flu vaccine already this season.  In the past has usually benefited from combination of antibiotic and prednisone.  He had a couple of courses of prednisone now but only took 20 mg daily.  He was prescribed doxycycline prior to him last visit with me but only took this for about 2 or 3 days.  Past Medical History:  Diagnosis Date   Arthritis    Kidney stones 2011   had kidney stones spring of the year and then previously 20 yrs    Past Surgical History:  Procedure Laterality Date   TONSILLECTOMY      reports that he has been smoking cigarettes. He has never used smokeless tobacco. He reports current alcohol use. He reports that he does not use drugs. family history includes Alzheimer's disease in his mother; Cancer (age of onset: 23) in his father; Healthy in his daughter and son. No Known Allergies  Review of Systems  Constitutional:  Negative for chills and fever.  HENT:  Negative for sinus pain.   Respiratory:  Positive for cough, sputum production and wheezing. Negative for hemoptysis.   Cardiovascular:  Negative for chest pain.      Objective:     BP 126/76 (BP Location: Left Arm, Patient Position: Sitting, Cuff Size: Normal)   Pulse (!) 53   Temp  98 F (36.7 C) (Oral)   Ht 5\' 4"  (1.626 m)   Wt 154 lb 4.8 oz (70 kg)   SpO2 97%   BMI 26.49 kg/m  BP Readings from Last 3 Encounters:  01/06/23 126/76  12/29/22 (!) 150/64  12/21/22 120/70   Wt Readings from Last 3 Encounters:  01/06/23 154 lb 4.8 oz (70 kg)  12/28/22 155 lb 1.6 oz (70.4 kg)  12/21/22 152 lb 8 oz (69.2 kg)      Physical Exam Vitals reviewed.  Constitutional:      General: He is not in acute distress.    Appearance: Normal appearance. He is not toxic-appearing.  Cardiovascular:     Rate and Rhythm: Normal rate and regular rhythm.  Pulmonary:     Comments: From what diminished breath sounds throughout but greater so in the right base compared to the left.  No rales. Neurological:     Mental Status: He is alert.      No results found for any visits on 01/06/23.  Last CBC Lab Results  Component Value Date   WBC 16.0 (H) 03/15/2019   HGB 13.5 03/15/2019   HCT 39.7 03/15/2019   MCV 93.2 03/15/2019   MCH 31.7 03/15/2019   RDW 12.8 03/15/2019   PLT 275 03/15/2019   Last metabolic panel Lab Results  Component Value Date  GLUCOSE 97 03/15/2019   NA 137 03/15/2019   K 4.5 03/15/2019   CL 99 03/15/2019   CO2 28 03/15/2019   BUN 21 03/15/2019   CREATININE 1.79 (H) 03/15/2019   GFRNONAA 37 (L) 03/15/2019   CALCIUM 9.1 03/15/2019   PROT 6.3 03/14/2019   ALBUMIN 4.3 03/14/2019   BILITOT 0.8 03/14/2019   ALKPHOS 49 03/14/2019   AST 25 03/14/2019   ALT 21 03/14/2019   ANIONGAP 10 03/15/2019      The ASCVD Risk score (Arnett DK, et al., 2019) failed to calculate for the following reasons:   Cannot find a previous HDL lab   Cannot find a previous total cholesterol lab    Assessment & Plan:   Acute exacerbation of COPD.  Patient no respiratory distress.  No documented fever.  We recommend the following  -Prednisone 20 mg 2 tablets daily for 6 days -Start Omnicef 300 mg twice daily for 7 days -Start Symbicort 160 mg 2 puffs twice daily and  rinse mouth after use -He already has follow-up scheduled about 3 weeks and reassess at that point -We did discuss getting some pulmonary function testing and we will consider setting up at follow-up visit once his acute symptoms have subsided   Evelena Peat, MD

## 2023-01-16 ENCOUNTER — Other Ambulatory Visit: Payer: Self-pay | Admitting: Physician Assistant

## 2023-01-22 ENCOUNTER — Telehealth: Payer: Self-pay | Admitting: Family Medicine

## 2023-01-22 NOTE — Telephone Encounter (Signed)
Pt call and stated he was calling you back but he want you to know all is good and he have a appt with dr. Caryl Never on 01/29/23.

## 2023-01-27 NOTE — Telephone Encounter (Signed)
Noted  

## 2023-01-29 ENCOUNTER — Encounter: Payer: Self-pay | Admitting: Family Medicine

## 2023-01-29 ENCOUNTER — Ambulatory Visit: Payer: Federal, State, Local not specified - PPO | Admitting: Family Medicine

## 2023-01-29 VITALS — BP 138/70 | HR 69 | Temp 97.6°F | Ht 64.0 in | Wt 155.8 lb

## 2023-01-29 DIAGNOSIS — J449 Chronic obstructive pulmonary disease, unspecified: Secondary | ICD-10-CM | POA: Diagnosis not present

## 2023-01-29 DIAGNOSIS — F17200 Nicotine dependence, unspecified, uncomplicated: Secondary | ICD-10-CM | POA: Diagnosis not present

## 2023-01-29 DIAGNOSIS — N400 Enlarged prostate without lower urinary tract symptoms: Secondary | ICD-10-CM

## 2023-01-29 DIAGNOSIS — M150 Primary generalized (osteo)arthritis: Secondary | ICD-10-CM | POA: Diagnosis not present

## 2023-01-29 NOTE — Patient Instructions (Signed)
Get back on the Symbicort two puffs twice daily  Rinse mouth after use.    Follow up for any increased cough, fever, or shortness of breath.

## 2023-01-29 NOTE — Progress Notes (Signed)
Established Patient Office Visit  Subjective   Patient ID: Maxwell Leonard, male    DOB: 1945-05-31  Age: 77 y.o. MRN: [id number]  Chief Complaint  Patient presents with   Medical Management of Chronic Issues    HPI   Maxwell Leonard is seen for medical follow-up.  He had recent COPD exacerbation and was treated with multiple course of antibiotic and prednisone.  He eventually improved after taking Omnicef and prednisone by mouth.  We had also recommend starting Symbicort 2 puffs twice daily.  He apparently only took this for few days and saw dramatic improvement after recent antibiotics and stopped this after just a few days.  He denies any chronic cough.  Denies any dyspnea with day-to-day activities.  Still smokes about 4 cigarettes/day.  No recent hemoptysis.  Weight is up couple pounds from last visit.  He does have rescue inhaler to use as well. Recent chest x-ray a month ago showed no acute findings.  No obvious pneumonia.  Immunizations are up-to-date.  He is had flu, RSV, and pneumonia vaccinations.  He has osteoarthritis involving multiple joints especially hands.  Takes Celebrex but infrequently.  History of BPH.  Has taken tamsulosin in the past but apparently took himself off.  Denies any slow stream or obstructive symptoms at this time. No significant nocturia.  Past Medical History:  Diagnosis Date   Arthritis    Kidney stones 2011   had kidney stones spring of the year and then previously 20 yrs    Past Surgical History:  Procedure Laterality Date   TONSILLECTOMY      reports that he has been smoking cigarettes. He has never used smokeless tobacco. He reports current alcohol use. He reports that he does not use drugs. family history includes Alzheimer's disease in his mother; Cancer (age of onset: 60) in his father; Healthy in his daughter and son. No Known Allergies  Review of Systems  Constitutional:  Negative for chills, fever and malaise/fatigue.  Eyes:   Negative for blurred vision.  Respiratory:  Negative for cough, shortness of breath and wheezing.   Cardiovascular:  Negative for chest pain.  Musculoskeletal:  Positive for joint pain.  Neurological:  Negative for dizziness, weakness and headaches.      Objective:     BP 138/70 (BP Location: Left Arm, Patient Position: Sitting, Cuff Size: Normal)   Pulse 69   Temp 97.6 F (36.4 C) (Oral)   Ht 5\' 4"  (1.626 m)   Wt 155 lb 12.8 oz (70.7 kg)   SpO2 94%   BMI 26.74 kg/m  BP Readings from Last 3 Encounters:  01/29/23 138/70  01/06/23 126/76  12/29/22 (!) 150/64   Wt Readings from Last 3 Encounters:  01/29/23 155 lb 12.8 oz (70.7 kg)  01/06/23 154 lb 4.8 oz (70 kg)  12/28/22 155 lb 1.6 oz (70.4 kg)      Physical Exam Constitutional:      General: He is not in acute distress.    Appearance: He is well-developed.  HENT:     Head: Normocephalic and atraumatic.  Eyes:     Pupils: Pupils are equal, round, and reactive to light.  Neck:     Thyroid: No thyromegaly.  Cardiovascular:     Rate and Rhythm: Normal rate and regular rhythm.     Heart sounds: No murmur heard. Pulmonary:     Effort: No respiratory distress.     Breath sounds: No stridor. No wheezing or rales.  Comments: Does have somewhat diminished breath sounds throughout and right base greater than left with no wheezes.  No rales. Abdominal:     General: Bowel sounds are normal.     Palpations: Abdomen is soft.     Tenderness: There is no abdominal tenderness.  Musculoskeletal:        General: No edema.     Cervical back: Normal range of motion and neck supple.  Lymphadenopathy:     Cervical: No cervical adenopathy.  Neurological:     Mental Status: He is alert and oriented to person, place, and time.  Psychiatric:        Mood and Affect: Mood and affect normal.      No results found for any visits on 01/29/23.  Last CBC Lab Results  Component Value Date   WBC 16.0 (H) 03/15/2019   HGB 13.5  03/15/2019   HCT 39.7 03/15/2019   MCV 93.2 03/15/2019   MCH 31.7 03/15/2019   RDW 12.8 03/15/2019   PLT 275 03/15/2019   Last metabolic panel Lab Results  Component Value Date   GLUCOSE 97 03/15/2019   NA 137 03/15/2019   K 4.5 03/15/2019   CL 99 03/15/2019   CO2 28 03/15/2019   BUN 21 03/15/2019   CREATININE 1.79 (H) 03/15/2019   GFRNONAA 37 (L) 03/15/2019   CALCIUM 9.1 03/15/2019   PROT 6.3 03/14/2019   ALBUMIN 4.3 03/14/2019   BILITOT 0.8 03/14/2019   ALKPHOS 49 03/14/2019   AST 25 03/14/2019   ALT 21 03/14/2019   ANIONGAP 10 03/15/2019      The ASCVD Risk score (Arnett DK, et al., 2019) failed to calculate for the following reasons:   Cannot find a previous HDL lab   Cannot find a previous total cholesterol lab    Assessment & Plan:   #1 COPD.  Patient had recent exacerbation currently improved.  He unfortunately's not been taking Symbicort and would recommend he get back on that 2 puffs twice daily and cautioned to rinse mouth after use.  Immunizations are up-to-date with flu vaccine, RSV, and pneumonia vaccinations. -We again discussed possible referral to pulmonary or at least pulmonary function test but he declines -Still smokes about 4 cigarettes/day but low motivation to quit  #2 osteoarthritis involving multiple joints but especially hands.  Uses Celebrex.  Cautioned about risk of nonsteroidals at his age.  Try to use Tylenol as first-line.  May supplement with Celebrex as needed  #3 history of BPH.  Has been on tamsulosin in the past.  Currently asymptomatic.  Observe for now.  Follow-up promptly for any slow stream.  Avoid anticholinergic medications including over-the-counter medications such as Benadryl  Did note that he has had lower GFR in the past chronic kidney disease stage III range and recommend labs at follow-up.  He left today before labs were drawn.  No follow-ups on file.    Evelena Peat, MD

## 2023-04-05 ENCOUNTER — Other Ambulatory Visit: Payer: Self-pay | Admitting: Family Medicine

## 2023-04-05 NOTE — Telephone Encounter (Signed)
Rx done.

## 2023-05-10 ENCOUNTER — Ambulatory Visit: Payer: Self-pay | Admitting: Family Medicine

## 2023-05-10 ENCOUNTER — Telehealth: Payer: Self-pay

## 2023-05-10 ENCOUNTER — Other Ambulatory Visit: Payer: Self-pay | Admitting: Family Medicine

## 2023-05-10 NOTE — Telephone Encounter (Signed)
 Copied from CRM [id number]. Topic: Clinical - Medication Refill >> May 10, 2023  1:06 PM Ernst Spell wrote: Most Recent Primary Care Visit:  Provider: Kristian Covey  Department: [facility]  Visit Type: OFFICE VISIT  Date: 01/29/2023  Medication: celebrex  Has the patient contacted their pharmacy? Yes No refills  Is this the correct pharmacy for this prescription? Yes If no, delete pharmacy and type the correct one.  This is the patient's preferred pharmacy:  [id number] [place], [place] - [address] [place] [address] Phone: [phone] Fax: [phone]   Has the prescription been filled recently? Yes  Is the patient out of the medication? Yes  Has the patient been seen for an appointment in the last year OR does the patient have an upcoming appointment? Yes  Can we respond through MyChart? No  Agent: Please be advised that Rx refills may take up to 3 business days. We ask that you follow-up with your pharmacy.

## 2023-05-10 NOTE — Telephone Encounter (Signed)
 Last Fill: 12/29/21  Last OV: 01/29/23 Next OV: None Scheduled  Routing to provider for review/authorization.   Copied from CRM [id number]. Topic: Clinical - Medication Refill >> May 10, 2023  2:55 PM Aletta Edouard wrote: Most Recent Primary Care Visit:  Provider: Kristian Covey  Department: [facility]  Visit Type: OFFICE VISIT  Date: 01/29/2023  Medication: celecoxib (CELEBREX) 200 MG capsule [ Has the patient contacted their pharmacy? Yes (Agent: If no, request that the patient contact the pharmacy for the refill. If patient does not wish to contact the pharmacy document the reason why and proceed with request.) (Agent: If yes, when and what did the pharmacy advise?)  Is this the correct pharmacy for this prescription? Yes If no, delete pharmacy and type the correct one.  This is the patient's preferred pharmacy:  [id number] [place], [place] - [address] [place] [address] Phone: [phone] Fax: [phone]   Has the prescription been filled recently? No  Is the patient out of the medication? Yes  Has the patient been seen for an appointment in the last year OR does the patient have an upcoming appointment? Yes  Can we respond through MyChart? No  Agent: Please be advised that Rx refills may take up to 3 business days. We ask that you follow-up with your pharmacy.

## 2023-05-10 NOTE — Telephone Encounter (Signed)
 Pt calling in concerned about recent news of measles outbreak and is wanting advice on preventing it. This RN advised pt to continue with good handwashing, wear a mask when you are in public or in heavily congested areas and avoid contact with known sick contacts. Pt would like to ask PCP if he feels an MMR/Measles booster is appropriate due to his age. Routing to provider for review.  Copied from CRM [id number]. Topic: Clinical - Medical Advice >> May 10, 2023  1:08 PM Taleah C wrote: Reason for CRM: pt called and would like to speak with a nurse for advice if he needs to do anything about the measles outbreak since he is elderly. Please call and advise. Reason for Disposition  [1] Follow-up call from patient regarding patient's clinical status AND [2] information NON-URGENT  Answer Assessment - Initial Assessment Questions 1. REASON FOR CALL or QUESTION: "What is your reason for calling today?" or "How can I best help you?" or "What question do you have that I can help answer?"     Pt requesting information on preventing measles  2. CALLER: Document the source of call. (e.g., laboratory, patient).     Pt  Protocols used: PCP Call - No Triage-A-AH

## 2023-05-10 NOTE — Telephone Encounter (Signed)
 Copied from CRM [id number]. Topic: General - Other >> May 10, 2023  2:56 PM Rodman Pickle T wrote: Reason for CRM: patient would like a callback regarding the measles that's going around he is ck concerned about that

## 2023-05-11 MED ORDER — CELECOXIB 200 MG PO CAPS: ORAL_CAPSULE | ORAL | 0 refills | Status: AC

## 2023-05-11 NOTE — Telephone Encounter (Signed)
 Rx done.

## 2023-05-11 NOTE — Telephone Encounter (Signed)
 Patient informed of the message below.

## 2023-06-15 ENCOUNTER — Other Ambulatory Visit: Payer: Self-pay | Admitting: Family Medicine

## 2023-06-16 ENCOUNTER — Ambulatory Visit: Admitting: Family Medicine

## 2023-06-16 ENCOUNTER — Encounter: Payer: Self-pay | Admitting: Family Medicine

## 2023-06-16 VITALS — BP 158/68 | HR 71 | Temp 98.4°F | Wt 155.4 lb

## 2023-06-16 DIAGNOSIS — R030 Elevated blood-pressure reading, without diagnosis of hypertension: Secondary | ICD-10-CM | POA: Diagnosis not present

## 2023-06-16 DIAGNOSIS — M150 Primary generalized (osteo)arthritis: Secondary | ICD-10-CM

## 2023-06-16 DIAGNOSIS — N183 Chronic kidney disease, stage 3 unspecified: Secondary | ICD-10-CM

## 2023-06-16 LAB — COMPREHENSIVE METABOLIC PANEL WITH GFR
ALT: 13 U/L (ref 0–53)
AST: 19 U/L (ref 0–37)
Albumin: 4.4 g/dL (ref 3.5–5.2)
Alkaline Phosphatase: 48 U/L (ref 39–117)
BUN: 19 mg/dL (ref 6–23)
CO2: 31 meq/L (ref 19–32)
Calcium: 8.8 mg/dL (ref 8.4–10.5)
Chloride: 101 meq/L (ref 96–112)
Creatinine, Ser: 1.2 mg/dL (ref 0.40–1.50)
GFR: 57.63 mL/min — ABNORMAL LOW (ref >60.00)
Glucose, Bld: 89 mg/dL (ref 70–99)
Potassium: 4.5 meq/L (ref 3.5–5.1)
Sodium: 139 meq/L (ref 135–145)
Total Bilirubin: 1 mg/dL (ref 0.2–1.2)
Total Protein: 6.4 g/dL (ref 6.0–8.3)

## 2023-06-16 LAB — LIPID PANEL
Cholesterol: 174 mg/dL (ref 0–200)
HDL: 67.1 mg/dL (ref >39.00)
LDL Cholesterol: 91 mg/dL (ref 0–99)
NonHDL: 106.51
Total CHOL/HDL Ratio: 3
Triglycerides: 79 mg/dL (ref 0.0–149.0)
VLDL: 15.8 mg/dL (ref 0.0–40.0)

## 2023-06-16 NOTE — Patient Instructions (Signed)
 Try to scale back the wine  Keep salt intake down  Consider home BP cuff (Omron is a good brand)  Set up one month follow up and bring in your readings then.

## 2023-06-16 NOTE — Progress Notes (Signed)
 Established Patient Office Visit  Subjective   Patient ID: Maxwell Leonard, male    DOB: [birth year]  Age: 78 y.o. MRN: [id number]  Chief Complaint  Patient presents with   Medication Consultation    HPI   Maxwell Leonard is seen today to consider some follow-up labs.  He had called requesting refill of nonsteroidal which he takes intermittently for neck pain.  We had concerns because of his age and also the fact that he has some chronic kidney disease by most recent basic metabolic panel which way back in 2021.  We recommend follow-up to get some follow-up labs and discuss other potential medication options.  He has taken Celebrex in the past but states that actually over-the-counter Advil works better.  He has not tried Tylenol.  Blood pressure was quite elevated today with initial reading 160/94 came down after rest to 158/68.  Does not have home blood pressure cuff.  No history of hypertension.  Denies recent headaches.  Does drink at least 2 glasses of wine per day.  Also, recent Advil use as above.  Ongoing nicotine use.  History of COPD.  No recent exacerbations-since last fall.  Past Medical History:  Diagnosis Date   Arthritis    Kidney stones 2011   had kidney stones spring of the year and then previously 20 yrs    Past Surgical History:  Procedure Laterality Date   TONSILLECTOMY      reports that he has been smoking cigarettes. He has never used smokeless tobacco. He reports current alcohol use. He reports that he does not use drugs. family history includes Alzheimer's disease in his mother; Cancer (age of onset: 57) in his father; Healthy in his daughter and son. No Known Allergies   Review of Systems  Constitutional:  Negative for malaise/fatigue.  Eyes:  Negative for blurred vision.  Respiratory:  Negative for cough, hemoptysis and wheezing.   Cardiovascular:  Negative for chest pain.  Musculoskeletal:  Positive for neck pain.  Neurological:  Negative for dizziness,  weakness and headaches.      Objective:     BP (!) 158/68 (BP Location: Left Arm, Cuff Size: Normal)   Pulse 71   Temp 98.4 F (36.9 C) (Oral)   Wt 155 lb 6.4 oz (70.5 kg)   SpO2 96%   BMI 26.67 kg/m    Physical Exam Vitals reviewed.  Constitutional:      General: He is not in acute distress.    Appearance: He is well-developed. He is not ill-appearing.  Eyes:     Pupils: Pupils are equal, round, and reactive to light.  Neck:     Thyroid: No thyromegaly.  Cardiovascular:     Rate and Rhythm: Normal rate and regular rhythm.  Pulmonary:     Effort: Pulmonary effort is normal. No respiratory distress.     Breath sounds: Normal breath sounds. No wheezing or rales.  Musculoskeletal:     Cervical back: Neck supple.     Right lower leg: No edema.     Left lower leg: No edema.  Neurological:     Mental Status: He is alert and oriented to person, place, and time.      No results found for any visits on 06/16/23.    The ASCVD Risk score (Arnett DK, et al., 2019) failed to calculate for the following reasons:   Cannot find a previous HDL lab   Cannot find a previous total cholesterol lab    Assessment & Plan:   #  1 osteoarthritis involving multiple joints -especially cervical spine.  He has taken Celebrex in the past and more recently has been taking over-the-counter Advil fairly regularly.  We discussed our concerns regarding chronic use of any nonsteroidal given his age and other comorbidities.  We recommend he try instead Tylenol 500 mg up to 2 every 6 hours as needed.  #2 elevated blood pressure without diagnosis of hypertension.  Several possible exacerbating factors including increased wine consumption and regular consumption of Advil.  We recommend try to restrict both and also get home cuff and check several readings a week now and visit in 1 month to reassess.  Also stressed importance of low-sodium diet  #3 history of chronic kidney disease by prior lab work.   Recheck comprehensive metabolic panel today   Return in about 1 month (around 07/16/2023).    Evelena Peat, MD

## 2023-06-28 ENCOUNTER — Encounter: Payer: Self-pay | Admitting: Physician Assistant

## 2023-06-28 ENCOUNTER — Other Ambulatory Visit (INDEPENDENT_AMBULATORY_CARE_PROVIDER_SITE_OTHER): Payer: Self-pay

## 2023-06-28 ENCOUNTER — Ambulatory Visit (INDEPENDENT_AMBULATORY_CARE_PROVIDER_SITE_OTHER): Admitting: Physician Assistant

## 2023-06-28 DIAGNOSIS — M542 Cervicalgia: Secondary | ICD-10-CM

## 2023-06-28 DIAGNOSIS — M5010 Cervical disc disorder with radiculopathy, unspecified cervical region: Secondary | ICD-10-CM | POA: Insufficient documentation

## 2023-06-28 MED ORDER — METHOCARBAMOL 500 MG PO TABS: 500.0000 mg | ORAL_TABLET | Freq: Four times a day (QID) | ORAL | 0 refills | Status: AC | PRN

## 2023-06-28 NOTE — Patient Instructions (Signed)
 Patient is to follow up with Dr Daisey Dryer for injection

## 2023-06-28 NOTE — Addendum Note (Signed)
 Addended by: Georgann Kim on: 06/28/2023 02:45 PM   Modules accepted: Orders

## 2023-06-28 NOTE — Progress Notes (Signed)
 Office Visit Note   Patient: Maxwell Leonard           Date of Birth: 19-Sep-1945           MRN: [id number] Visit Date: 06/28/2023              Requested by: [name], MD [address] [place],  [place] [address] PCP: [name], MD   Assessment & Plan: Visit Diagnoses:  1. Neck pain   2. Cervical disc disorder with radiculopathy, unspecified cervical region     Plan: Patient is a pleasant 78 year old gentleman who was a patient of [name].  He comes in today with a chief complaint of neck pain radiating down into his leftshoulder.  Denies any paresthesias or weakness.  Denies any injury.  Have been bothering him but is worse the last 9 months or so.  He is been taking ibuprofen but his primary care provider asked that he switch to Tylenol  which does help when he takes it.  He is requesting if a steroid injection might help him.  He does have advanced degenerative changes and a lot of spasm in his neck.  Very little motion especially with extension and turning side-to-side.  Will order an MRI and he can see [name] for an injection.  In the meantime we will call him in some Robaxin   Follow-Up Instructions: No follow-ups on file.   Orders:  Orders Placed This Encounter  Procedures   XR Cervical Spine 2 or 3 views   No orders of the defined types were placed in this encounter.     Procedures: No procedures performed   Clinical Data: No additional findings.   Subjective: Neck pain and stiffness  HPI Pleasant 78 year old gentleman with increasing neck pain and stiffness.  Denies any fever or chills.  Does have a history of shoulder issues on the left but says this is more in his neck Review of Systems  All other systems reviewed and are negative.    Objective: Vital Signs: There were no vitals taken for this visit.  Physical Exam Constitutional:      Appearance: Normal appearance.  Skin:    General: Skin is warm and dry.   Neurological:     General: No focal deficit present.     Mental Status: He is alert and oriented to person, place, and time.  Psychiatric:        Mood and Affect: Mood normal.        Behavior: Behavior normal.     Ortho Exam Nation he has fairly good flexion to his chest and his chin he has no almost no extension and turning to the right and left is also somewhat stiff no step-offs noted he does have a lot of spasm in the paravertebral muscles.  Grip strength and arm strength is intact good shoulder range of motion Specialty Comments:  No specialty comments available.  Imaging: XR Cervical Spine 2 or 3 views Result Date: 06/28/2023 2 view radiographs of his cervical spine demonstrate advanced degenerative changes with loss of joint space endplate sclerosis and osteophyte formation and advanced facet arthropathy straightening of the normal lordotic curve    PMFS History: Patient Active Problem List   Diagnosis Date Noted   Cervical disc disorder with radiculopathy, unspecified cervical region 06/28/2023   Left thigh pain 12/17/2021   Low back pain 11/26/2020   Unilateral primary osteoarthritis, right knee 08/15/2020   History of kidney stones 07/10/2019  COPD (chronic obstructive pulmonary disease) (HCC) 07/10/2019   Osteoarthritis 02/02/2011   Nicotine use disorder 02/02/2011   Erectile dysfunction 02/02/2011   Past Medical History:  Diagnosis Date   Arthritis    Kidney stones 2011   had kidney stones spring of the year and then previously 20 yrs     Family History  Problem Relation Age of Onset   Healthy Daughter    Healthy Son    Alzheimer's disease Mother    Cancer Father 3       lung cancer    Past Surgical History:  Procedure Laterality Date   TONSILLECTOMY     Social History   Occupational History   Not on file  Tobacco Use   Smoking status: Every Day    Current packs/day: 0.50    Types: Cigarettes   Smokeless tobacco: Never  Vaping Use   Vaping  status: Never Used  Substance and Sexual Activity   Alcohol use: Yes   Drug use: No   Sexual activity: Not on file

## 2023-07-02 ENCOUNTER — Ambulatory Visit
Admission: RE | Admit: 2023-07-02 | Discharge: 2023-07-02 | Disposition: A | Discharge status: Home / Self Care | Source: Ambulatory Visit | Attending: Physician Assistant | Admitting: Physician Assistant

## 2023-07-02 DIAGNOSIS — M542 Cervicalgia: Secondary | ICD-10-CM

## 2023-07-12 ENCOUNTER — Telehealth: Payer: Self-pay

## 2023-07-12 NOTE — Telephone Encounter (Signed)
 Copied from CRM [id number]. Topic: General - Other >> Jul 12, 2023  3:26 PM Dorisann Garre T wrote: Reason for CRM: patient would like a call back to go over his mri results

## 2023-07-14 NOTE — Telephone Encounter (Signed)
 Left a message for the patient to return my call.

## 2023-07-14 NOTE — Telephone Encounter (Signed)
 Patient informed of the message below.

## 2023-07-16 ENCOUNTER — Encounter: Payer: Self-pay | Admitting: Family Medicine

## 2023-07-16 ENCOUNTER — Ambulatory Visit: Admitting: Family Medicine

## 2023-07-16 VITALS — BP 152/70 | HR 53 | Temp 97.9°F | Wt 154.8 lb

## 2023-07-16 DIAGNOSIS — I10 Essential (primary) hypertension: Secondary | ICD-10-CM | POA: Insufficient documentation

## 2023-07-16 DIAGNOSIS — N183 Chronic kidney disease, stage 3 unspecified: Secondary | ICD-10-CM | POA: Insufficient documentation

## 2023-07-16 MED ORDER — AMLODIPINE BESYLATE 5 MG PO TABS
5.0000 mg | ORAL_TABLET | Freq: Every day | ORAL | 3 refills | Status: AC
Start: 2023-07-16 — End: ?

## 2023-07-16 NOTE — Progress Notes (Signed)
 Established Patient Office Visit  Subjective   Patient ID: Maxwell Leonard, male    DOB: 22-Sep-1945  Age: 78 y.o. MRN: [id number]  Chief Complaint  Patient presents with   Medical Management of Chronic Issues    HPI   Maxwell Leonard seen for follow-up regarding hypertension.  Refer to last note for detail.  He unfortunately has not scaled back wine at all and still drinks about 3 glasses at night.  Still using some Advil for recent shoulder problem.  Never got home blood pressure cuff.  No headaches or dizziness.  No chest pains.  No peripheral edema.  Never treated for hypertension previously.  Had recent labs.  Creatinine 1.2 with GFR 57.  This is actually improved compared with last labs which were done 4 years ago.  He had total cholesterol 174 with HDL of 67, triglycerides 79, and LDL 91.  Still smokes some.  No history of cardiac disease.   No regular exercise but has consider starting walking program soon.    Past Medical History:  Diagnosis Date   Arthritis    Kidney stones 2011   had kidney stones spring of the year and then previously 20 yrs    Past Surgical History:  Procedure Laterality Date   TONSILLECTOMY      reports that he has been smoking cigarettes. He has never used smokeless tobacco. He reports current alcohol use. He reports that he does not use drugs. family history includes Alzheimer's disease in his mother; Cancer (age of onset: 38) in his father; Healthy in his daughter and son. No Known Allergies  Review of Systems  Constitutional:  Negative for malaise/fatigue.  Eyes:  Negative for blurred vision.  Respiratory:  Negative for shortness of breath.   Cardiovascular:  Negative for chest pain.  Neurological:  Negative for dizziness, weakness and headaches.      Objective:     BP (!) 152/70 (BP Location: Left Arm, Patient Position: Sitting, Cuff Size: Normal)   Pulse (!) 53   Temp 97.9 F (36.6 C) (Oral)   Wt 154 lb 12.8 oz (70.2 kg)   SpO2 96%   BMI  26.57 kg/m  BP Readings from Last 3 Encounters:  07/16/23 (!) 152/70  06/16/23 (!) 158/68  01/29/23 138/70   Wt Readings from Last 3 Encounters:  07/16/23 154 lb 12.8 oz (70.2 kg)  06/16/23 155 lb 6.4 oz (70.5 kg)  01/29/23 155 lb 12.8 oz (70.7 kg)      Physical Exam Vitals reviewed.  Constitutional:      General: He is not in acute distress.    Appearance: He is well-developed. He is not ill-appearing.  Eyes:     Pupils: Pupils are equal, round, and reactive to light.  Neck:     Thyroid: No thyromegaly.  Cardiovascular:     Rate and Rhythm: Normal rate and regular rhythm.  Pulmonary:     Effort: Pulmonary effort is normal. No respiratory distress.     Breath sounds: Normal breath sounds. No wheezing or rales.  Musculoskeletal:     Cervical back: Neck supple.     Right lower leg: No edema.     Left lower leg: No edema.  Neurological:     Mental Status: He is alert and oriented to person, place, and time.      No results found for any visits on 07/16/23.  Last metabolic panel Lab Results  Component Value Date   GLUCOSE 89 06/16/2023   NA 139 06/16/2023  K 4.5 06/16/2023   CL 101 06/16/2023   CO2 31 06/16/2023   BUN 19 06/16/2023   CREATININE 1.20 06/16/2023   GFR 57.63 (L) 06/16/2023   CALCIUM 8.8 06/16/2023   PROT 6.4 06/16/2023   ALBUMIN 4.4 06/16/2023   BILITOT 1.0 06/16/2023   ALKPHOS 48 06/16/2023   AST 19 06/16/2023   ALT 13 06/16/2023   ANIONGAP 10 03/15/2019   Last lipids Lab Results  Component Value Date   CHOL 174 06/16/2023   HDL 67.10 06/16/2023   LDLCALC 91 06/16/2023   TRIG 79.0 06/16/2023   CHOLHDL 3 06/16/2023      The 09-[id number] ASCVD risk score (Arnett DK, et al., 2019) is: 36.2%    Assessment & Plan:   #1 hypertension currently untreated.  We discussed again nonpharmacologic management.  We strongly emphasized trying to reduce wine consumption and also avoid regular use of nonsteroidals.  He is still encouraged to get a home  blood pressure cuff to monitor periodically.  We discussed initiating amlodipine 5 mg once daily and set up 1 month follow-up to reassess.  Also discussed low-sodium diet Will try to start regular walking program  #2 chronic kidney disease by recent labs stage IIIa.  Stressed importance of avoidance of regular use of nonsteroidals and importance of blood pressure control.  Maxwell Lamy, MD

## 2023-07-16 NOTE — Patient Instructions (Signed)
 Reduce the wine, as discussed  Try to start walking more for exercise.  Start the Amlodipine 5 mg  once daily  Set up one month follow up.

## 2023-07-20 ENCOUNTER — Encounter: Payer: Self-pay | Admitting: Physician Assistant

## 2023-07-20 ENCOUNTER — Ambulatory Visit (INDEPENDENT_AMBULATORY_CARE_PROVIDER_SITE_OTHER): Admitting: Physician Assistant

## 2023-07-20 DIAGNOSIS — M5010 Cervical disc disorder with radiculopathy, unspecified cervical region: Secondary | ICD-10-CM

## 2023-07-20 MED ORDER — MELOXICAM 7.5 MG PO TABS: 7.5000 mg | ORAL_TABLET | Freq: Every day | ORAL | 0 refills | Status: AC

## 2023-07-20 NOTE — Progress Notes (Signed)
 Office Visit Note   Patient: Maxwell Leonard           Date of Birth: 16-Aug-1945           MRN: [id number] Visit Date: 07/20/2023              Requested by: [name], MD [address] [place],  [place] [address] PCP: [name], MD  No chief complaint on file.     HPI: Patient is a 78 year old gentleman who is a patient of Dr. Aviva Lemmings.  He has a 29-month history of neck pain.  All focused on the left.  I have seen him and other than pain he has no neurologic deficit.  He is taking Advil makes him feel just a little bit better because of the long time distance and failure of conservative treatment I did order an MRI he is here to review that today.  Pain is about the same.  He stopped taking Celebrex  because it really did not help him at all.    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  Both going left and right.  Obviously his symptoms are more on the left.  I did review the x-ray with [name].  Because of the multiple changes he is asked that I have him make a follow-up appoint with with his nurse practitioner will follow-up with Megan in the meantime he is having quite a bit of pain he can stop the Advil and we will give him a Toradol injection see if that helps him today he does have good kidney function.  Just a mild decrease in GFR.  Will also call in some meloxicam 7-1/2 mg when pain returns should wait a couple days after the Toradol injection  Follow-Up Instructions: Return in about 2 weeks (around 08/03/2023).   Ortho Exam  Patient is alert, oriented, no adenopathy, well-dressed, normal affect, normal respiratory effort. Exam he has stiffness and pain when turning his neck side-to-side and with flexion extension not as bad neurologically intact grip strength intact biceps triceps strength intact no pain in his shoulder  Imaging: No results found. No images are attached to  the encounter.  Labs: Lab Results  Component Value Date   REPTSTATUS 05/09/2010 FINAL 05/07/2010   CULT NO GROWTH 05/07/2010     Lab Results  Component Value Date   ALBUMIN 4.4 06/16/2023   ALBUMIN 4.3 03/14/2019    No results found for: "MG" No results found for: "VD25OH"  No results found for: "PREALBUMIN"    Latest Ref Rng & Units 03/15/2019    4:57 PM 03/14/2019   10:17 AM 05/07/2010   10:00 PM  CBC EXTENDED  WBC 4.0 - 10.5 K/uL 16.0  16.0  13.3   RBC 4.22 - 5.81 MIL/uL 4.26  4.28  4.88   Hemoglobin 13.0 - 17.0 g/dL 19.1  47.8  29.5   HCT 39.0 - 52.0 % 39.7  39.0  42.2   Platelets 150 - 400 K/uL 275  280.0  249   NEUT# 1.4 - 7.7 K/uL  13.3  10.7   Lymph# 0.7 - 4.0 K/uL  1.1  1.6      There is no height or weight on file to calculate BMI.  Orders:  No orders of the defined types were placed in this encounter.  Meds ordered this encounter  Medications   meloxicam (MOBIC) 7.5 MG tablet  Sig: Take 1 tablet (7.5 mg total) by mouth daily.    Dispense:  30 tablet    Refill:  0     Procedures: No procedures performed  Clinical Data: No additional findings.  ROS:  All other systems negative, except as noted in the HPI. Review of Systems  Objective: Vital Signs: There were no vitals taken for this visit.  Specialty Comments:  No specialty comments available.  PMFS History: Patient Active Problem List   Diagnosis Date Noted   CKD (chronic kidney disease) stage 3, GFR 30-59 ml/min (HCC) 07/16/2023   Essential hypertension 07/16/2023   Cervical disc disorder with radiculopathy, unspecified cervical region 06/28/2023   Left thigh pain 12/17/2021   Low back pain 11/26/2020   Unilateral primary osteoarthritis, right knee 08/15/2020   History of kidney stones 07/10/2019   COPD (chronic obstructive pulmonary disease) (HCC) 07/10/2019   Osteoarthritis 02/02/2011   Nicotine use disorder 02/02/2011   Erectile dysfunction 02/02/2011   Past Medical History:   Diagnosis Date   Arthritis    Kidney stones 2011   had kidney stones spring of the year and then previously 20 yrs     Family History  Problem Relation Age of Onset   Healthy Daughter    Healthy Son    Alzheimer's disease Mother    Cancer Father 25       lung cancer    Past Surgical History:  Procedure Laterality Date   TONSILLECTOMY     Social History   Occupational History   Not on file  Tobacco Use   Smoking status: Every Day    Current packs/day: 0.50    Types: Cigarettes   Smokeless tobacco: Never  Vaping Use   Vaping status: Never Used  Substance and Sexual Activity   Alcohol use: Yes   Drug use: No   Sexual activity: Not on file

## 2023-07-29 ENCOUNTER — Encounter: Payer: Self-pay | Admitting: Physical Medicine and Rehabilitation

## 2023-07-29 ENCOUNTER — Ambulatory Visit: Admitting: Physical Medicine and Rehabilitation

## 2023-07-29 VITALS — BP 127/73 | HR 67

## 2023-07-29 DIAGNOSIS — M47812 Spondylosis without myelopathy or radiculopathy, cervical region: Secondary | ICD-10-CM | POA: Diagnosis not present

## 2023-07-29 DIAGNOSIS — M5412 Radiculopathy, cervical region: Secondary | ICD-10-CM | POA: Diagnosis not present

## 2023-07-29 DIAGNOSIS — M4802 Spinal stenosis, cervical region: Secondary | ICD-10-CM

## 2023-07-29 NOTE — Progress Notes (Signed)
 Pain Scale   Average Pain 6  Neck pain into left shoulder. He had a recent MRI. Taking methocarbamol  and it helps some.

## 2023-07-29 NOTE — Progress Notes (Signed)
 CZAR YSAGUIRRE - 78 y.o. male MRN [id number]  Date of birth: [birth year]  Office Visit Note: Visit Date: 07/29/2023 PCP: [name], MD Referred by: [name], MD  Subjective: Chief Complaint  Patient presents with   Neck - Pain   Left Shoulder - Pain   HPI: Maxwell Leonard is a 78 y.o. male who comes in today per the request of Norma Beckers Persons, PA for evaluation of chronic, worsening and severe left sided neck pain radiating to shoulder. He is a long time patient of Dr. Loy Ruff. Pain ongoing for about 1 year. His pain is constant, becomes severe with side to side rotation. He describes pain as dull aching sensation, currently rates as 7 out of 10. Some relief of pain with home exercise regimen, rest and use of medications. Some relief of pain with Meloxicam  and Tylenol . Recent cervical MRI imaging shows degenerative marrow edema of the left lateral C1-C2,degenerative facet ankylosis on the right at both C5-C6 and C7-T1. Also moderate/severe degenerative foraminal stenosis at the right C4, left C5, bilateral C6, and right C8 nerve levels. No high grade spinal canal stenosis noted. No history of cervical injections/surgery. Patient denies focal weakness, numbness and tingling. No recent trauma or falls.         Review of Systems  Musculoskeletal:  Positive for joint pain, myalgias and neck pain.  Neurological:  Negative for tingling, sensory change, focal weakness and weakness.  All other systems reviewed and are negative.  Otherwise per HPI.  Assessment & Plan: Visit Diagnoses:    ICD-10-CM   1. Radiculopathy, cervical region  M54.12     2. Foraminal stenosis of cervical region  M48.02     3. Facet arthropathy, cervical  M47.812        Plan: Findings:  Chronic, worsening and severe left sided neck pain radiating to shoulder. Patient continues to have severe pain despite good conservative therapies such as home exercise regimen, rest and use of  medications. Patients clinical presentation and exam are complex, differentials include cervical radiculopathy vs facet mediated pain. I did review cervical MRI with him today using imaging and spine model. He has significant multi level degenerative arthritis, there is also multi level foraminal narrowing. He does have limited range of motion to his neck today.  We discussed treatment plan in detail today. Next step is to perform diagnostic and hopefully therapeutic left C7-T1 interlaminar epidural steroid injection under fluoroscopic guidance. If good relief of pain with this injection we can repeat this procedure infrequently. He is not currently taking anticoagulant medications. He is also requesting IM Toradol  injection today, I did complete this injection in the office today without difficulty. Should his pain present as more facet mediated we could look at C2-C3 injections, unfortunately injections at the level of C1-C2 are complex due to anatomy of vertebral arteries. Would also recommend short course of physical therapy with a focus on range of motion exercises at some point. [name] at bedside to answer any questions. No red flag symptoms noted upon exam today.     Meds & Orders: No orders of the defined types were placed in this encounter.   Orders Placed This Encounter  Procedures   Small Joint Inj    Follow-up: Return for Left C7-T1 interlaminar epidural steroid injection.   Procedures: Small Joint Inj on 07/29/2023 8:47 AM Indications: pain Details: 25 G needle Medications: 30 mg ketorolac  30 MG/ML Outcome: tolerated well, no immediate complications  Intramuscular  injection to left deltoid, patient tolerated without difficulty. Consent was given by the patient. Immediately prior to procedure a time out was called to verify the correct patient, procedure, equipment, support staff and site/side marked as required. Patient was prepped and draped in the usual sterile fashion.           Clinical History: EXAM: MRI CERVICAL SPINE WITHOUT CONTRAST   Vertebrae: Confluent but degenerative appearing marrow edema in the left C1-C2 vertebrae (series 105, image 11). Asymmetric joint space loss there are evident on the radiographs. Additionally, the degenerative marrow edema tracks from the left C2 body into the odontoid (mild series 105, image 8). Contralateral right C1 and C2 marrow signal remains normal. Skull base marrow signal remains normal. See additional degenerative findings at C1-C2 below.   Normal background bone marrow signal. Mix of acute and chronic degenerative marrow signal changes at C6-C7, both Modic type 1 and type 2 changes with patchy marrow edema.  Cord: Normal, no convincing degenerative cord mass effect. And capacious visible spinal canal at most levels.   Posterior Fossa, vertebral arteries, paraspinal tissues: Cervicomedullary junction is within normal limits. Negative visible posterior fossa. Preserved major vascular flow voids in the bilateral neck. The left vertebral artery appears somewhat dominant.   There is patchy soft tissue inflammation also lateral to the right C1-C2 articulation which otherwise remains normal. See series 105, image 1). No suspicious findings in that area on axial MRI images.   Other bilateral neck soft tissues are within normal limits. Grossly negative visible upper lungs.   Disc levels:   C1-C2: Bulky degenerative appearing ligamentous hypertrophy about the odontoid. Asymmetric left C1-C2 joint space loss with the degenerative marrow edema described above. No spinal stenosis.   C2-C3: Mild to moderate facet hypertrophy greater on the left. Mild disc bulging and endplate spurring. No significant stenosis.   C3-C4: Disc space loss. Circumferential disc osteophyte complex with broad-based posterior component. Moderate to severe facet hypertrophy greater on the left. No convincing facet ankylosis. No spinal  stenosis. Mild to moderate left and moderate to severe right C4 foraminal stenosis.   C4-C5: Bulky moderate to severe facet hypertrophy greater on the left. Asymmetric, mostly foraminal disc osteophyte complex greater on the left. No spinal stenosis. Severe left and mild-to-moderate right C5 foraminal stenosis.   C5-C6: More symmetric and moderate bilateral facet degenerative hypertrophy, but there is evidence of degenerative facet ankylosis at this level on the right side (series 104, image 2). Superimposed symmetric disc bulging and endplate spurring. No spinal stenosis. Moderate to severe bilateral C6 foraminal stenosis.   C6-C7: Disc space loss with bulky circumferential disc osteophyte complex. Broad-based posterior component of disc. Comparatively mild facet and ligament flavum hypertrophy. Mild spinal stenosis (series 106, images 29 and 30) without convincing spinal cord mass effect. Moderate to severe bilateral C7 foraminal stenosis.   C7-T1: Mild anterolisthesis. Moderate facet hypertrophy greater on the right and evidence of facet ankylosis on that side. Mild disc bulging and endplate spurring more pronounced on the right. Degenerative joint fluid incidentally noted at the left 1st rib costovertebral junction. Moderate right C8 foraminal stenosis.   IMPRESSION: 1. 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   2. Confluent degenerative marrow edema of the left lateral C1-C2 vertebrae; acute exacerbation of chronic joint degeneration there. Bulky degenerative appearing ligamentous hypertrophy about the odontoid. No associated spinal stenosis.   3. Mild multifactorial spinal and  up to severe bilateral foraminal stenosis at the intervening C6-C7 segment. No convincing spinal cord mass effect.   4. Other cervical disc degeneration and facet arthropathy.  Moderate or severe degenerative neural foraminal stenosis also at the right C4, left C5, bilateral C6, and right C8 nerve levels.     Electronically Signed   By: Marlise Simpers M.D.   On: 07/20/2023 08:28   He reports that he has been smoking cigarettes. He has never used smokeless tobacco. No results for input(s): "HGBA1C", "LABURIC" in the last 8760 hours.  Objective:  VS:  HT:    WT:   BMI:     BP:127/73  HR:67bpm  TEMP: ( )  RESP:  Physical Exam Vitals and nursing note reviewed.  HENT:     Head: Normocephalic and atraumatic.     Right Ear: External ear normal.     Left Ear: External ear normal.     Nose: Nose normal.     Mouth/Throat:     Mouth: Mucous membranes are moist.  Eyes:     Extraocular Movements: Extraocular movements intact.  Cardiovascular:     Rate and Rhythm: Normal rate.     Pulses: Normal pulses.  Pulmonary:     Effort: Pulmonary effort is normal.  Abdominal:     General: Abdomen is flat. There is no distension.  Musculoskeletal:        General: Tenderness present.     Cervical back: Tenderness present.     Comments: Limited range of motion to neck. Some discomfort noted with flexion, extension and side-to-side rotation. Patient has good strength in the upper extremities including 5 out of 5 strength in wrist extension, long finger flexion and APB. Shoulder range of motion is full bilaterally without any sign of impingement. There is no atrophy of the hands intrinsically. Sensation intact bilaterally. Negative Hoffman's sign. Negative Spurling's sign.     Skin:    General: Skin is warm and dry.     Capillary Refill: Capillary refill takes less than 2 seconds.  Neurological:     General: No focal deficit present.     Mental Status: He is alert and oriented to person, place, and time.  Psychiatric:        Mood and Affect: Mood normal.        Behavior: Behavior normal.     Ortho Exam  Imaging: No results found.  Past Medical/Family/Surgical/Social  History: Medications & Allergies reviewed per EMR, new medications updated. Patient Active Problem List   Diagnosis Date Noted   CKD (chronic kidney disease) stage 3, GFR 30-59 ml/min (HCC) 07/16/2023   Essential hypertension 07/16/2023   Cervical disc disorder with radiculopathy, unspecified cervical region 06/28/2023   Left thigh pain 12/17/2021   Low back pain 11/26/2020   Unilateral primary osteoarthritis, right knee 08/15/2020   History of kidney stones 07/10/2019   COPD (chronic obstructive pulmonary disease) (HCC) 07/10/2019   Osteoarthritis 02/02/2011   Nicotine use disorder 02/02/2011   Erectile dysfunction 02/02/2011   Past Medical History:  Diagnosis Date   Arthritis    Kidney stones 2011   had kidney stones spring of the year and then previously 20 yrs    Family History  Problem Relation Age of Onset   Healthy Daughter    Healthy Son    Alzheimer's disease Mother    Cancer Father 1       lung cancer   Past Surgical History:  Procedure Laterality Date   TONSILLECTOMY  Social History   Occupational History   Not on file  Tobacco Use   Smoking status: Every Day    Current packs/day: 0.50    Types: Cigarettes   Smokeless tobacco: Never  Vaping Use   Vaping status: Never Used  Substance and Sexual Activity   Alcohol use: Yes   Drug use: No   Sexual activity: Not on file

## 2023-07-30 MED ORDER — KETOROLAC TROMETHAMINE 30 MG/ML IJ SOLN
30.0000 mg | INTRAMUSCULAR | Status: AC | PRN
  Administered 2023-07-29: 30 mg via INTRAMUSCULAR

## 2023-08-11 ENCOUNTER — Ambulatory Visit: Admitting: Physical Medicine and Rehabilitation

## 2023-08-11 ENCOUNTER — Other Ambulatory Visit: Payer: Self-pay

## 2023-08-11 VITALS — BP 148/74 | HR 69

## 2023-08-11 DIAGNOSIS — M5412 Radiculopathy, cervical region: Secondary | ICD-10-CM

## 2023-08-11 MED ORDER — METHYLPREDNISOLONE ACETATE 40 MG/ML IJ SUSP
40.0000 mg | Freq: Once | INTRAMUSCULAR | Status: AC
  Administered 2023-08-11: 40 mg

## 2023-08-11 NOTE — Progress Notes (Unsigned)
 Pain Scale   Average Pain 6 Patient advising he has neck pain that radiates to his left shoulder without relief        +Driver, -BT, -Dye Allergies.

## 2023-08-11 NOTE — Patient Instructions (Signed)

## 2023-08-12 NOTE — Progress Notes (Signed)
 Maxwell Leonard - 78 y.o. male MRN [id number]  Date of birth: 1945/12/08  Office Visit Note: Visit Date: 08/11/2023 PCP: [name], MD Referred by: [name], MD  Subjective: Chief Complaint  Patient presents with   Neck - Pain   HPI:  Maxwell Leonard is a 78 y.o. male who comes in today at the request of Elvan Hamel, FNP for planned Left C7-T1 Cervical Interlaminar epidural steroid injection with fluoroscopic guidance.  The patient has failed conservative care including home exercise, medications, time and activity modification.  This injection will be diagnostic and hopefully therapeutic.  Please see requesting physician notes for further details and justification.   ROS Otherwise per HPI.  Assessment & Plan: Visit Diagnoses:    ICD-10-CM   1. Radiculopathy, cervical region  M54.12 XR C-ARM NO REPORT    Epidural Steroid injection    methylPREDNISolone  acetate (DEPO-MEDROL ) injection 40 mg      Plan: No additional findings.   Meds & Orders:  Meds ordered this encounter  Medications   methylPREDNISolone  acetate (DEPO-MEDROL ) injection 40 mg    Orders Placed This Encounter  Procedures   XR C-ARM NO REPORT   Epidural Steroid injection    Follow-up: Return if symptoms worsen or fail to improve.   Procedures: No procedures performed  Cervical Epidural Steroid Injection - Interlaminar Approach with Fluoroscopic Guidance  Patient: Maxwell Leonard      Date of Birth: [birth year] MRN: [id number] PCP: [name], MD      Visit Date: 08/11/2023   Universal Protocol:    Date/Time: 06/05/256:41 AM  Consent Given By: the patient  Position: PRONE  Additional Comments: Vital signs were monitored before and after the procedure. Patient was prepped and draped in the usual sterile fashion. The correct patient, procedure, and site was verified.   Injection Procedure Details:   Procedure diagnoses: Radiculopathy, cervical region [M54.12]    Meds  Administered:  Meds ordered this encounter  Medications   methylPREDNISolone  acetate (DEPO-MEDROL ) injection 40 mg     Laterality: Left  Location/Site: C7-T1  Needle: 3.5 in., 20 ga. Tuohy  Needle Placement: Paramedian epidural space  Findings:  -Comments: Excellent flow of contrast into the epidural space.  Procedure Details: Using a paramedian approach from the side mentioned above, the region overlying the inferior lamina was localized under fluoroscopic visualization and the soft tissues overlying this structure were infiltrated with 4 ml. of 1% Lidocaine without Epinephrine . A # 20 gauge, Tuohy needle was inserted into the epidural space using a paramedian approach.  The epidural space was localized using loss of resistance along with contralateral oblique bi-planar fluoroscopic views.  After negative aspirate for air, blood, and CSF, a 2 ml. volume of Isovue-250 was injected into the epidural space and the flow of contrast was observed. Radiographs were obtained for documentation purposes.   The injectate was administered into the level noted above.  Additional Comments:  The patient tolerated the procedure well Dressing: 2 x 2 sterile gauze and Band-Aid    Post-procedure details: Patient was observed during the procedure. Post-procedure instructions were reviewed.  Patient left the clinic in stable condition.   Clinical History: EXAM: MRI CERVICAL SPINE WITHOUT CONTRAST   Vertebrae: Confluent but degenerative appearing marrow edema in the left C1-C2 vertebrae (series 105, image 11). Asymmetric joint space loss there are evident on the radiographs. Additionally, the degenerative marrow edema tracks from the left C2 body into the odontoid (mild series 105, image 8). Contralateral  right C1 and C2 marrow signal remains normal. Skull base marrow signal remains normal. See additional degenerative findings at C1-C2 below.   Normal background bone marrow signal. Mix of  acute and chronic degenerative marrow signal changes at C6-C7, both Modic type 1 and type 2 changes with patchy marrow edema.  Cord: Normal, no convincing degenerative cord mass effect. And capacious visible spinal canal at most levels.   Posterior Fossa, vertebral arteries, paraspinal tissues: Cervicomedullary junction is within normal limits. Negative visible posterior fossa. Preserved major vascular flow voids in the bilateral neck. The left vertebral artery appears somewhat dominant.   There is patchy soft tissue inflammation also lateral to the right C1-C2 articulation which otherwise remains normal. See series 105, image 1). No suspicious findings in that area on axial MRI images.   Other bilateral neck soft tissues are within normal limits. Grossly negative visible upper lungs.   Disc levels:   C1-C2: Bulky degenerative appearing ligamentous hypertrophy about the odontoid. Asymmetric left C1-C2 joint space loss with the degenerative marrow edema described above. No spinal stenosis.   C2-C3: Mild to moderate facet hypertrophy greater on the left. Mild disc bulging and endplate spurring. No significant stenosis.   C3-C4: Disc space loss. Circumferential disc osteophyte complex with broad-based posterior component. Moderate to severe facet hypertrophy greater on the left. No convincing facet ankylosis. No spinal stenosis. Mild to moderate left and moderate to severe right C4 foraminal stenosis.   C4-C5: Bulky moderate to severe facet hypertrophy greater on the left. Asymmetric, mostly foraminal disc osteophyte complex greater on the left. No spinal stenosis. Severe left and mild-to-moderate right C5 foraminal stenosis.   C5-C6: More symmetric and moderate bilateral facet degenerative hypertrophy, but there is evidence of degenerative facet ankylosis at this level on the right side (series 104, image 2). Superimposed symmetric disc bulging and endplate spurring. No spinal  stenosis. Moderate to severe bilateral C6 foraminal stenosis.   C6-C7: Disc space loss with bulky circumferential disc osteophyte complex. Broad-based posterior component of disc. Comparatively mild facet and ligament flavum hypertrophy. Mild spinal stenosis (series 106, images 29 and 30) without convincing spinal cord mass effect. Moderate to severe bilateral C7 foraminal stenosis.   C7-T1: Mild anterolisthesis. Moderate facet hypertrophy greater on the right and evidence of facet ankylosis on that side. Mild disc bulging and endplate spurring more pronounced on the right. Degenerative joint fluid incidentally noted at the left 1st rib costovertebral junction. Moderate right C8 foraminal stenosis.   IMPRESSION: 1. 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   2. Confluent degenerative marrow edema of the left lateral C1-C2 vertebrae; acute exacerbation of chronic joint degeneration there. Bulky degenerative appearing ligamentous hypertrophy about the odontoid. No associated spinal stenosis.   3. Mild multifactorial spinal and up to severe bilateral foraminal stenosis at the intervening C6-C7 segment. No convincing spinal cord mass effect.   4. Other cervical disc degeneration and facet arthropathy. Moderate or severe degenerative neural foraminal stenosis also at the right C4, left C5, bilateral C6, and right C8 nerve levels.     Electronically Signed   By: Marlise Simpers M.D.   On: 07/20/2023 08:28     Objective:  VS:  HT:    WT:   BMI:     BP:(!) 148/74  HR:69bpm  TEMP: ( )  RESP:  Physical Exam Vitals and nursing note reviewed.  Constitutional:      General:  He is not in acute distress.    Appearance: Normal appearance. He is not ill-appearing.  HENT:     Head: Normocephalic and atraumatic.     Right Ear: External ear normal.     Left Ear: External ear  normal.  Eyes:     Extraocular Movements: Extraocular movements intact.  Cardiovascular:     Rate and Rhythm: Normal rate.     Pulses: Normal pulses.  Abdominal:     General: There is no distension.     Palpations: Abdomen is soft.  Musculoskeletal:        General: No signs of injury.     Cervical back: Neck supple. Tenderness present. No rigidity.     Right lower leg: No edema.     Left lower leg: No edema.     Comments: Patient has good strength in the upper extremities with 5 out of 5 strength in wrist extension long finger flexion APB.  No intrinsic hand muscle atrophy.  Negative Hoffmann's test.  Lymphadenopathy:     Cervical: No cervical adenopathy.  Skin:    Findings: No erythema or rash.  Neurological:     General: No focal deficit present.     Mental Status: He is alert and oriented to person, place, and time.     Sensory: No sensory deficit.     Motor: No weakness or abnormal muscle tone.     Coordination: Coordination normal.  Psychiatric:        Mood and Affect: Mood normal.        Behavior: Behavior normal.      Imaging: XR C-ARM NO REPORT Result Date: 08/11/2023 Please see Notes tab for imaging impression.

## 2023-08-12 NOTE — Procedures (Signed)
 Cervical Epidural Steroid Injection - Interlaminar Approach with Fluoroscopic Guidance  Patient: Maxwell Leonard      Date of Birth: Jan 13, 1946 MRN: [id number] PCP: [name], MD      Visit Date: 08/11/2023   Universal Protocol:    Date/Time: 06/05/256:41 AM  Consent Given By: the patient  Position: PRONE  Additional Comments: Vital signs were monitored before and after the procedure. Patient was prepped and draped in the usual sterile fashion. The correct patient, procedure, and site was verified.   Injection Procedure Details:   Procedure diagnoses: Radiculopathy, cervical region [M54.12]    Meds Administered:  Meds ordered this encounter  Medications   methylPREDNISolone  acetate (DEPO-MEDROL ) injection 40 mg     Laterality: Left  Location/Site: C7-T1  Needle: 3.5 in., 20 ga. Tuohy  Needle Placement: Paramedian epidural space  Findings:  -Comments: Excellent flow of contrast into the epidural space.  Procedure Details: Using a paramedian approach from the side mentioned above, the region overlying the inferior lamina was localized under fluoroscopic visualization and the soft tissues overlying this structure were infiltrated with 4 ml. of 1% Lidocaine without Epinephrine . A # 20 gauge, Tuohy needle was inserted into the epidural space using a paramedian approach.  The epidural space was localized using loss of resistance along with contralateral oblique bi-planar fluoroscopic views.  After negative aspirate for air, blood, and CSF, a 2 ml. volume of Isovue-250 was injected into the epidural space and the flow of contrast was observed. Radiographs were obtained for documentation purposes.   The injectate was administered into the level noted above.  Additional Comments:  The patient tolerated the procedure well Dressing: 2 x 2 sterile gauze and Band-Aid    Post-procedure details: Patient was observed during the procedure. Post-procedure instructions were  reviewed.  Patient left the clinic in stable condition.

## 2023-08-16 ENCOUNTER — Ambulatory Visit: Admitting: Family Medicine

## 2023-08-16 ENCOUNTER — Other Ambulatory Visit: Payer: Self-pay | Admitting: Physician Assistant

## 2023-08-16 ENCOUNTER — Encounter: Payer: Self-pay | Admitting: Family Medicine

## 2023-08-16 VITALS — BP 138/78 | HR 78 | Temp 97.6°F | Wt 152.5 lb

## 2023-08-16 DIAGNOSIS — M150 Primary generalized (osteo)arthritis: Secondary | ICD-10-CM | POA: Diagnosis not present

## 2023-08-16 DIAGNOSIS — I10 Essential (primary) hypertension: Secondary | ICD-10-CM

## 2023-08-16 DIAGNOSIS — N183 Chronic kidney disease, stage 3 unspecified: Secondary | ICD-10-CM

## 2023-08-16 NOTE — Progress Notes (Signed)
 Established Patient Office Visit  Subjective   Patient ID: Maxwell Leonard, male    DOB: 1945/12/14  Age: 78 y.o. MRN: [id number]  Chief Complaint  Patient presents with   Medical Management of Chronic Issues    HPI   Tou seen for follow-up regarding hypertension.  Refer to last note for detail.  He started amlodipine  5 mg daily.  He just acquired home blood pressure cuff but has not taken any readings yet.  He is tolerating amlodipine  well without any side effects such as edema or headache.  Generally feels well.  Has had some ongoing arthritis pains and recently had some upper back pain.  Had injection from physiatry specialist and has some physical therapy set up.  He takes Tylenol  as needed for arthritis and does supplement occasionally with Celebrex  or meloxicam .  Does have chronic kidney disease with recent GFR just under 60.  No recent peripheral edema.  No chest pains.  Past Medical History:  Diagnosis Date   Arthritis    Kidney stones 2011   had kidney stones spring of the year and then previously 20 yrs    Past Surgical History:  Procedure Laterality Date   TONSILLECTOMY      reports that he has been smoking cigarettes. He has never used smokeless tobacco. He reports current alcohol use. He reports that he does not use drugs. family history includes Alzheimer's disease in his mother; Cancer (age of onset: 68) in his father; Healthy in his daughter and son. No Known Allergies  Review of Systems  Constitutional:  Negative for malaise/fatigue.  Eyes:  Negative for blurred vision.  Respiratory:  Negative for hemoptysis.   Cardiovascular:  Negative for chest pain.  Neurological:  Negative for dizziness, weakness and headaches.      Objective:     BP 138/78 (BP Location: Left Arm, Cuff Size: Normal)   Pulse 78   Temp 97.6 F (36.4 C) (Oral)   Wt 152 lb 8 oz (69.2 kg)   SpO2 95%   BMI 26.18 kg/m  BP Readings from Last 3 Encounters:  08/16/23 138/78  08/11/23  (!) 148/74  07/29/23 127/73   Wt Readings from Last 3 Encounters:  08/16/23 152 lb 8 oz (69.2 kg)  07/16/23 154 lb 12.8 oz (70.2 kg)  06/16/23 155 lb 6.4 oz (70.5 kg)      Physical Exam Vitals reviewed.  Constitutional:      General: He is not in acute distress.    Appearance: He is well-developed. He is not ill-appearing.  Eyes:     Pupils: Pupils are equal, round, and reactive to light.  Neck:     Thyroid: No thyromegaly.  Cardiovascular:     Rate and Rhythm: Normal rate and regular rhythm.  Pulmonary:     Effort: Pulmonary effort is normal. No respiratory distress.     Breath sounds: Normal breath sounds. No wheezing or rales.  Musculoskeletal:     Cervical back: Neck supple.     Right lower leg: No edema.     Left lower leg: No edema.  Neurological:     Mental Status: He is alert and oriented to person, place, and time.      No results found for any visits on 08/16/23.  Last CBC Lab Results  Component Value Date   WBC 16.0 (H) 03/15/2019   HGB 13.5 03/15/2019   HCT 39.7 03/15/2019   MCV 93.2 03/15/2019   MCH 31.7 03/15/2019   RDW 12.8 03/15/2019  PLT 275 03/15/2019   Last metabolic panel Lab Results  Component Value Date   GLUCOSE 89 06/16/2023   NA 139 06/16/2023   K 4.5 06/16/2023   CL 101 06/16/2023   CO2 31 06/16/2023   BUN 19 06/16/2023   CREATININE 1.20 06/16/2023   GFR 57.63 (L) 06/16/2023   CALCIUM 8.8 06/16/2023   PROT 6.4 06/16/2023   ALBUMIN 4.4 06/16/2023   BILITOT 1.0 06/16/2023   ALKPHOS 48 06/16/2023   AST 19 06/16/2023   ALT 13 06/16/2023   ANIONGAP 10 03/15/2019   Last lipids Lab Results  Component Value Date   CHOL 174 06/16/2023   HDL 67.10 06/16/2023   LDLCALC 91 06/16/2023   TRIG 79.0 06/16/2023   CHOLHDL 3 06/16/2023   Last hemoglobin A1c No results found for: "HGBA1C"    The 10-year ASCVD risk score (Arnett DK, et al., 2019) is: 36%    Assessment & Plan:   #1 hypertension improved with recent addition of  amlodipine  5 mg daily.  Initial blood pressure was up slightly today but did come down some after rest.  We recommend some home monitoring with his new cuff over the next several weeks.  Try to watch sodium intake closely.  Be in touch if systolic readings consistently over 140  #2 history of chronic kidney disease stage III a.  We recommended avoidance of regular nonsteroidals.  Try Tylenol  as first-line for treating arthritis pain.  Stay well-hydrated.  Recheck renal function at follow-up Glean Lamy, MD

## 2023-09-13 ENCOUNTER — Encounter: Payer: Self-pay | Admitting: Physical Medicine and Rehabilitation

## 2023-09-13 ENCOUNTER — Ambulatory Visit: Admitting: Physical Medicine and Rehabilitation

## 2023-09-13 DIAGNOSIS — M542 Cervicalgia: Secondary | ICD-10-CM

## 2023-09-13 DIAGNOSIS — M47812 Spondylosis without myelopathy or radiculopathy, cervical region: Secondary | ICD-10-CM

## 2023-09-13 NOTE — Progress Notes (Unsigned)
 Maxwell Leonard - 78 y.o. male MRN [id number]  Date of birth: [birth year]  Office Visit Note: Visit Date: 09/13/2023 PCP: Micheal Wolm ORN, MD Referred by: Micheal Wolm ORN, MD  Subjective: Chief Complaint  Patient presents with   Neck - Follow-up   HPI: Maxwell Leonard is a 78 y.o. male who comes in today for evaluation of chronic, worsening and severe left sided neck pain radiating to shoulder. He is a long time patient of Dr. Maude Right. Pain ongoing for about 1 year. His pain worsens with side to side rotation. He describes pain as dull and aching sensation, currently rates as 8 out of 10. Some relief of pain with home exercise regimen, rest and use of medications. He does take Tylenol  twice daily with some relief of pain. Recent cervical MRI imaging shows degenerative marrow edema of the left lateral C1-C2 vertebrae; acute exacerbation of chronic joint degeneration there. There is also multi level foraminal stenosis. No high grade spinal canal stenosis noted. He underwent left C7-T1 interlaminar epidural steroid injection in our office on 08/11/2023. He reports some relief of pain for about 1 week following injection. His pain has slowly returned. Patient denies focal weakness, numbness and tingling. No recent trauma or falls.      Review of Systems  Musculoskeletal:  Positive for neck pain.  Neurological:  Negative for tingling, sensory change, focal weakness and weakness.  All other systems reviewed and are negative.  Otherwise per HPI.  Assessment & Plan: Visit Diagnoses:    ICD-10-CM   1. Cervicalgia  M54.2 Ambulatory referral to Physical Medicine Rehab    2. Spondylosis without myelopathy or radiculopathy, cervical region  M47.812 Ambulatory referral to Physical Medicine Rehab    3. Facet arthropathy, cervical  M47.812 Ambulatory referral to Physical Medicine Rehab       Plan: Findings:  Chronic, worsening and severe left-sided neck pain radiating to shoulder.  No  radicular symptoms down the arms.  Patient continues to have severe pain despite good conservative therapy such as home exercise regimen, rest and use of medications.  Short-term relief of pain with recent left C7-T1 interlaminar epidural steroid injection in June 2025.  Patient's clinical presentation and exam are most consistent with facet mediated pain.  He does have significant pain with left-sided rotation of his neck which correlates with recent cervical MRI findings.  There is degenerative marrow edema about the left C1-C2 vertebrae.  I discussed treatment plan with him in detail today.  Next step is to perform diagnostic and hopefully therapeutic left C2-C3 facet joint injection under fluoroscopic guidance. An injection at the level of C1-C2 would be challenging due to anatomical position of vertebral arteries. Patient has no questions regarding injection at this time. No red flag symptoms noted upon exam today.    Meds & Orders: No orders of the defined types were placed in this encounter.   Orders Placed This Encounter  Procedures   Ambulatory referral to Physical Medicine Rehab    Follow-up: Return for Left C2-C3 facet joint injection.   Procedures: No procedures performed      Clinical History: EXAM: MRI CERVICAL SPINE WITHOUT CONTRAST   Vertebrae: Confluent but degenerative appearing marrow edema in the left C1-C2 vertebrae (series 105, image 11). Asymmetric joint space loss there are evident on the radiographs. Additionally, the degenerative marrow edema tracks from the left C2 body into the odontoid (mild series 105, image 8). Contralateral right C1 and C2 marrow signal remains normal. Skull base  marrow signal remains normal. See additional degenerative findings at C1-C2 below.   Normal background bone marrow signal. Mix of acute and chronic degenerative marrow signal changes at C6-C7, both Modic type 1 and type 2 changes with patchy marrow edema.  Cord: Normal, no  convincing degenerative cord mass effect. And capacious visible spinal canal at most levels.   Posterior Fossa, vertebral arteries, paraspinal tissues: Cervicomedullary junction is within normal limits. Negative visible posterior fossa. Preserved major vascular flow voids in the bilateral neck. The left vertebral artery appears somewhat dominant.   There is patchy soft tissue inflammation also lateral to the right C1-C2 articulation which otherwise remains normal. See series 105, image 1). No suspicious findings in that area on axial MRI images.   Other bilateral neck soft tissues are within normal limits. Grossly negative visible upper lungs.   Disc levels:   C1-C2: Bulky degenerative appearing ligamentous hypertrophy about the odontoid. Asymmetric left C1-C2 joint space loss with the degenerative marrow edema described above. No spinal stenosis.   C2-C3: Mild to moderate facet hypertrophy greater on the left. Mild disc bulging and endplate spurring. No significant stenosis.   C3-C4: Disc space loss. Circumferential disc osteophyte complex with broad-based posterior component. Moderate to severe facet hypertrophy greater on the left. No convincing facet ankylosis. No spinal stenosis. Mild to moderate left and moderate to severe right C4 foraminal stenosis.   C4-C5: Bulky moderate to severe facet hypertrophy greater on the left. Asymmetric, mostly foraminal disc osteophyte complex greater on the left. No spinal stenosis. Severe left and mild-to-moderate right C5 foraminal stenosis.   C5-C6: More symmetric and moderate bilateral facet degenerative hypertrophy, but there is evidence of degenerative facet ankylosis at this level on the right side (series 104, image 2). Superimposed symmetric disc bulging and endplate spurring. No spinal stenosis. Moderate to severe bilateral C6 foraminal stenosis.   C6-C7: Disc space loss with bulky circumferential disc osteophyte complex.  Broad-based posterior component of disc. Comparatively mild facet and ligament flavum hypertrophy. Mild spinal stenosis (series 106, images 29 and 30) without convincing spinal cord mass effect. Moderate to severe bilateral C7 foraminal stenosis.   C7-T1: Mild anterolisthesis. Moderate facet hypertrophy greater on the right and evidence of facet ankylosis on that side. Mild disc bulging and endplate spurring more pronounced on the right. Degenerative joint fluid incidentally noted at the left 1st rib costovertebral junction. Moderate right C8 foraminal stenosis.   IMPRESSION: 1. 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   2. Confluent degenerative marrow edema of the left lateral C1-C2 vertebrae; acute exacerbation of chronic joint degeneration there. Bulky degenerative appearing ligamentous hypertrophy about the odontoid. No associated spinal stenosis.   3. Mild multifactorial spinal and up to severe bilateral foraminal stenosis at the intervening C6-C7 segment. No convincing spinal cord mass effect.   4. Other cervical disc degeneration and facet arthropathy. Moderate or severe degenerative neural foraminal stenosis also at the right C4, left C5, bilateral C6, and right C8 nerve levels.     Electronically Signed   By: VEAR Hurst M.D.   On: 07/20/2023 08:28   He reports that he has been smoking cigarettes. He has never used smokeless tobacco. No results for input(s): HGBA1C, LABURIC in the last 8760 hours.  Objective:  VS:  HT:    WT:   BMI:     BP:   HR: bpm  TEMP: ( )  RESP:  Physical Exam Vitals and nursing note reviewed.  HENT:     Head: Normocephalic and atraumatic.     Right Ear: External ear normal.     Left Ear: External ear normal.     Nose: Nose normal.     Mouth/Throat:     Mouth: Mucous membranes are moist.  Eyes:     Extraocular  Movements: Extraocular movements intact.  Cardiovascular:     Rate and Rhythm: Normal rate.     Pulses: Normal pulses.  Pulmonary:     Effort: Pulmonary effort is normal.  Abdominal:     General: Abdomen is flat. There is no distension.  Musculoskeletal:        General: Tenderness present.     Cervical back: Tenderness present.     Comments: No discomfort noted with flexion and extension of neck. Pain noted with side to side rotation, specifically left sided movement. Patient has good strength in the upper extremities including 5 out of 5 strength in wrist extension, long finger flexion and APB. Shoulder range of motion is full bilaterally without any sign of impingement. There is no atrophy of the hands intrinsically. Sensation intact bilaterally. Negative Hoffman's sign. Negative Spurling's sign.     Skin:    General: Skin is warm and dry.     Capillary Refill: Capillary refill takes less than 2 seconds.  Neurological:     General: No focal deficit present.     Mental Status: He is alert and oriented to person, place, and time.  Psychiatric:        Mood and Affect: Mood normal.        Behavior: Behavior normal.     Ortho Exam  Imaging: No results found.  Past Medical/Family/Surgical/Social History: Medications & Allergies reviewed per EMR, new medications updated. Patient Active Problem List   Diagnosis Date Noted   CKD (chronic kidney disease) stage 3, GFR 30-59 ml/min (HCC) 07/16/2023   Essential hypertension 07/16/2023   Cervical disc disorder with radiculopathy, unspecified cervical region 06/28/2023   Left thigh pain 12/17/2021   Low back pain 11/26/2020   Unilateral primary osteoarthritis, right knee 08/15/2020   History of kidney stones 07/10/2019   COPD (chronic obstructive pulmonary disease) (HCC) 07/10/2019   Osteoarthritis 02/02/2011   Nicotine use disorder 02/02/2011   Erectile dysfunction 02/02/2011   Past Medical History:  Diagnosis Date   Arthritis     Kidney stones 2011   had kidney stones spring of the year and then previously 20 yrs    Family History  Problem Relation Age of Onset   Healthy Daughter    Healthy Son    Alzheimer's disease Mother    Cancer Father 27       lung cancer   Past Surgical History:  Procedure Laterality Date   TONSILLECTOMY     Social History   Occupational History   Not on file  Tobacco Use   Smoking status: Every Day    Current packs/day: 0.50    Types: Cigarettes   Smokeless tobacco: Never  Vaping Use   Vaping status: Never Used  Substance and Sexual Activity   Alcohol use: Yes   Drug use: No   Sexual activity: Not on file

## 2023-09-13 NOTE — Progress Notes (Unsigned)
 Pain Scale   Average Pain 7 Patient advising the injection helped him for aprox 3 days but then his pain came back        +Driver, -BT, -Dye Allergies.

## 2023-09-21 ENCOUNTER — Telehealth: Payer: Self-pay | Admitting: Physical Medicine and Rehabilitation

## 2023-09-21 ENCOUNTER — Telehealth: Payer: Self-pay

## 2023-09-21 NOTE — Telephone Encounter (Signed)
 Patient called. Would like an appointment with Dr. Alvester Morin

## 2023-09-21 NOTE — Telephone Encounter (Signed)
 Patient called and advised he needed a injection due to shoulder pain. I spoke to patient advised that we dont treat shoulder pain and he would have to be evaluated by another provider that specializes in shoulders, he agreed to make appointment with Dr. Addie, I had also went through a series of questions about his pain to assure that he was being sent to the correct provider to provide the patient with the best care for his problems

## 2023-10-13 ENCOUNTER — Ambulatory Visit: Admitting: Orthopedic Surgery

## 2023-10-13 ENCOUNTER — Other Ambulatory Visit (INDEPENDENT_AMBULATORY_CARE_PROVIDER_SITE_OTHER): Payer: Self-pay

## 2023-10-13 ENCOUNTER — Other Ambulatory Visit: Payer: Self-pay

## 2023-10-13 DIAGNOSIS — M65912 Unspecified synovitis and tenosynovitis, left shoulder: Secondary | ICD-10-CM | POA: Diagnosis not present

## 2023-10-13 DIAGNOSIS — M25512 Pain in left shoulder: Secondary | ICD-10-CM

## 2023-10-13 NOTE — Progress Notes (Unsigned)
 Office Visit Note   Patient: Maxwell Leonard           Date of Birth: [birth year]           MRN: [id number] Visit Date: 10/13/2023 Requested by: Micheal Wolm ORN, MD [address] [place],  [place] [address] PCP: Micheal Wolm ORN, MD  Subjective: Chief Complaint  Patient presents with   Left Shoulder - Pain    HPI: Maxwell Leonard is a 78 y.o. male who presents to the office reporting left shoulder pain.  Been going on several months.  No known injury.  Pain does not wake him from sleep at night.  No radiating pain down the arm.  Denies any arm weakness or numbness and tingling.  Denies any right-handed symptoms.  Uses Advil cream and Tylenol .  Prior cervical spine ESI helped him for about 2 weeks.  Pain is in the trapezial region primarily.  Not really in the shoulder as much.  He is seeing Dr. Eldonna in 2 weeks.  Patient describes no significant pain with movement in that left shoulder..                ROS: All systems reviewed are negative as they relate to the chief complaint within the history of present illness.  Patient denies fevers or chills.  Assessment & Plan: Visit Diagnoses:  1. Left shoulder pain, unspecified chronicity     Plan: Impression is likely radicular pain affecting that left shoulder.  We will try a diagnostic and therapeutic intra-articular injection today.  However if that does not help then further treatment for the cervical spine by Dr. Jesslyn is indicated.  Otherwise we could consider imaging of the shoulder such as MRI arthrogram.  His exam is very benign today in terms of strength symmetric strength and no coarseness.  Follow-Up Instructions: No follow-ups on file.   Orders:  Orders Placed This Encounter  Procedures   XR Shoulder Left   US  Guided Needle Placement - No Linked Charges   No orders of the defined types were placed in this encounter.     Procedures: Large Joint Inj: L glenohumeral on 10/13/2023 8:41 PM Indications: diagnostic  evaluation and pain Details: 22 G 3.5 in needle, ultrasound-guided posterior approach  Arthrogram: No  Medications: 9 mL bupivacaine  0.5 %; 5 mL lidocaine  1 %; 40 mg triamcinolone  acetonide 40 MG/ML Outcome: tolerated well, no immediate complications Procedure, treatment alternatives, risks and benefits explained, specific risks discussed. Consent was given by the patient. Immediately prior to procedure a time out was called to verify the correct patient, procedure, equipment, support staff and site/side marked as required. Patient was prepped and draped in the usual sterile fashion.       Clinical Data: No additional findings.  Objective: Vital Signs: There were no vitals taken for this visit.  Physical Exam:  Constitutional: Patient appears well-developed HEENT:  Head: Normocephalic Eyes:EOM are normal Neck: Normal range of motion Cardiovascular: Normal rate Pulmonary/chest: Effort normal Neurologic: Patient is alert Skin: Skin is warm Psychiatric: Patient has normal mood and affect  Ortho Exam: Ortho exam demonstrates full symmetric range of motion in both shoulders.  50/100/165.  Excellent rotator cuff strength infraspinatus supraspinatus and subscap muscle testing bilaterally.  No coarse grinding or crepitus with internal/external rotation of the left shoulder.  No discrete AC joint tenderness.  O'Brien's testing is negative on the left negative on the right.  Does have some trapezial tenderness to direct palpation.  No other masses  lymphadenopathy or skin changes noted in that left shoulder girdle region.  Specialty Comments:  EXAM: MRI CERVICAL SPINE WITHOUT CONTRAST   Vertebrae: Confluent but degenerative appearing marrow edema in the left C1-C2 vertebrae (series 105, image 11). Asymmetric joint space loss there are evident on the radiographs. Additionally, the degenerative marrow edema tracks from the left C2 body into the odontoid (mild series 105, image 8).  Contralateral right C1 and C2 marrow signal remains normal. Skull base marrow signal remains normal. See additional degenerative findings at C1-C2 below.   Normal background bone marrow signal. Mix of acute and chronic degenerative marrow signal changes at C6-C7, both Modic type 1 and type 2 changes with patchy marrow edema.  Cord: Normal, no convincing degenerative cord mass effect. And capacious visible spinal canal at most levels.   Posterior Fossa, vertebral arteries, paraspinal tissues: Cervicomedullary junction is within normal limits. Negative visible posterior fossa. Preserved major vascular flow voids in the bilateral neck. The left vertebral artery appears somewhat dominant.   There is patchy soft tissue inflammation also lateral to the right C1-C2 articulation which otherwise remains normal. See series 105, image 1). No suspicious findings in that area on axial MRI images.   Other bilateral neck soft tissues are within normal limits. Grossly negative visible upper lungs.   Disc levels:   C1-C2: Bulky degenerative appearing ligamentous hypertrophy about the odontoid. Asymmetric left C1-C2 joint space loss with the degenerative marrow edema described above. No spinal stenosis.   C2-C3: Mild to moderate facet hypertrophy greater on the left. Mild disc bulging and endplate spurring. No significant stenosis.   C3-C4: Disc space loss. Circumferential disc osteophyte complex with broad-based posterior component. Moderate to severe facet hypertrophy greater on the left. No convincing facet ankylosis. No spinal stenosis. Mild to moderate left and moderate to severe right C4 foraminal stenosis.   C4-C5: Bulky moderate to severe facet hypertrophy greater on the left. Asymmetric, mostly foraminal disc osteophyte complex greater on the left. No spinal stenosis. Severe left and mild-to-moderate right C5 foraminal stenosis.   C5-C6: More symmetric and moderate bilateral facet  degenerative hypertrophy, but there is evidence of degenerative facet ankylosis at this level on the right side (series 104, image 2). Superimposed symmetric disc bulging and endplate spurring. No spinal stenosis. Moderate to severe bilateral C6 foraminal stenosis.   C6-C7: Disc space loss with bulky circumferential disc osteophyte complex. Broad-based posterior component of disc. Comparatively mild facet and ligament flavum hypertrophy. Mild spinal stenosis (series 106, images 29 and 30) without convincing spinal cord mass effect. Moderate to severe bilateral C7 foraminal stenosis.   C7-T1: Mild anterolisthesis. Moderate facet hypertrophy greater on the right and evidence of facet ankylosis on that side. Mild disc bulging and endplate spurring more pronounced on the right. Degenerative joint fluid incidentally noted at the left 1st rib costovertebral junction. Moderate right C8 foraminal stenosis.   IMPRESSION: 1. 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   2. Confluent degenerative marrow edema of the left lateral C1-C2 vertebrae; acute exacerbation of chronic joint degeneration there. Bulky degenerative appearing ligamentous hypertrophy about the odontoid. No associated spinal stenosis.   3. Mild multifactorial spinal and up to severe bilateral foraminal stenosis at the intervening C6-C7 segment. No convincing spinal cord mass effect.   4. Other cervical disc degeneration and facet arthropathy. Moderate or severe degenerative neural foraminal stenosis also at the right C4, left C5, bilateral C6,  and right C8 nerve levels.     Electronically Signed   By: VEAR Hurst M.D.   On: 07/20/2023 08:28  Imaging: No results found.   PMFS History: Patient Active Problem List   Diagnosis Date Noted   CKD (chronic kidney disease) stage 3, GFR 30-59 ml/min (HCC)  07/16/2023   Essential hypertension 07/16/2023   Cervical disc disorder with radiculopathy, unspecified cervical region 06/28/2023   Left thigh pain 12/17/2021   Low back pain 11/26/2020   Unilateral primary osteoarthritis, right knee 08/15/2020   History of kidney stones 07/10/2019   COPD (chronic obstructive pulmonary disease) (HCC) 07/10/2019   Osteoarthritis 02/02/2011   Nicotine use disorder 02/02/2011   Erectile dysfunction 02/02/2011   Past Medical History:  Diagnosis Date   Arthritis    Kidney stones 2011   had kidney stones spring of the year and then previously 20 yrs     Family History  Problem Relation Age of Onset   Healthy Daughter    Healthy Son    Alzheimer's disease Mother    Cancer Father 32       lung cancer    Past Surgical History:  Procedure Laterality Date   TONSILLECTOMY     Social History   Occupational History   Not on file  Tobacco Use   Smoking status: Every Day    Leonard packs/day: 0.50    Types: Cigarettes   Smokeless tobacco: Never  Vaping Use   Vaping status: Never Used  Substance and Sexual Activity   Alcohol use: Yes   Drug use: No   Sexual activity: Not on file

## 2023-10-14 ENCOUNTER — Encounter: Payer: Self-pay | Admitting: Orthopedic Surgery

## 2023-10-14 MED ORDER — TRIAMCINOLONE ACETONIDE 40 MG/ML IJ SUSP
40.0000 mg | INTRAMUSCULAR | Status: AC | PRN
  Administered 2023-10-13: 40 mg via INTRA_ARTICULAR

## 2023-10-14 MED ORDER — BUPIVACAINE HCL 0.5 % IJ SOLN
9.0000 mL | INTRAMUSCULAR | Status: AC | PRN
Start: 2023-10-13 — End: 2023-10-13
  Administered 2023-10-13: 9 mL via INTRA_ARTICULAR

## 2023-10-14 MED ORDER — LIDOCAINE HCL 1 % IJ SOLN
5.0000 mL | INTRAMUSCULAR | Status: AC | PRN
  Administered 2023-10-13: 5 mL

## 2023-10-26 ENCOUNTER — Encounter: Admitting: Physical Medicine and Rehabilitation

## 2023-10-26 ENCOUNTER — Other Ambulatory Visit: Payer: Self-pay | Admitting: Hand Surgery

## 2023-10-26 DIAGNOSIS — S46002A Unspecified injury of muscle(s) and tendon(s) of the rotator cuff of left shoulder, initial encounter: Secondary | ICD-10-CM

## 2023-11-10 ENCOUNTER — Ambulatory Visit
Admission: RE | Admit: 2023-11-10 | Discharge: 2023-11-10 | Disposition: A | Discharge status: Home / Self Care | Source: Ambulatory Visit | Attending: Hand Surgery | Admitting: Hand Surgery

## 2023-11-10 DIAGNOSIS — S46002A Unspecified injury of muscle(s) and tendon(s) of the rotator cuff of left shoulder, initial encounter: Secondary | ICD-10-CM

## 2024-01-10 ENCOUNTER — Encounter: Payer: Self-pay | Admitting: Radiology

## 2024-02-14 ENCOUNTER — Ambulatory Visit
